# Patient Record
Sex: Male | Born: 1985 | Race: White | Hispanic: No | Smoking: Never smoker
Health system: Southern US, Community
[De-identification: ages and names within clinical notes are randomized; demographics above are authoritative.]

## PROBLEM LIST (undated history)

## (undated) DIAGNOSIS — F419 Anxiety disorder, unspecified: Secondary | ICD-10-CM

## (undated) DIAGNOSIS — J40 Bronchitis, not specified as acute or chronic: Secondary | ICD-10-CM

## (undated) DIAGNOSIS — J449 Chronic obstructive pulmonary disease, unspecified: Secondary | ICD-10-CM

## (undated) HISTORY — PX: CYSTOSCOPY W/ URETEROSCOPY W/ LITHOTRIPSY: SUR380

## (undated) HISTORY — PX: CHOLECYSTECTOMY: SHX55

---

## 2004-01-01 ENCOUNTER — Emergency Department: Payer: Self-pay | Admitting: Emergency Medicine

## 2004-05-09 ENCOUNTER — Emergency Department: Payer: Self-pay | Admitting: Emergency Medicine

## 2004-05-27 ENCOUNTER — Emergency Department: Payer: Self-pay | Admitting: Unknown Physician Specialty

## 2004-05-28 ENCOUNTER — Emergency Department: Payer: Self-pay | Admitting: Emergency Medicine

## 2004-06-11 ENCOUNTER — Emergency Department: Payer: Self-pay | Admitting: Emergency Medicine

## 2004-06-15 ENCOUNTER — Emergency Department: Payer: Self-pay | Admitting: Emergency Medicine

## 2004-07-18 ENCOUNTER — Emergency Department: Payer: Self-pay | Admitting: Emergency Medicine

## 2005-01-08 ENCOUNTER — Emergency Department: Payer: Self-pay | Admitting: Emergency Medicine

## 2005-01-09 ENCOUNTER — Other Ambulatory Visit: Payer: Self-pay

## 2005-05-01 ENCOUNTER — Emergency Department: Payer: Self-pay | Admitting: Emergency Medicine

## 2005-08-28 ENCOUNTER — Emergency Department: Payer: Self-pay | Admitting: Emergency Medicine

## 2006-05-24 ENCOUNTER — Emergency Department: Payer: Self-pay | Admitting: Unknown Physician Specialty

## 2006-05-24 ENCOUNTER — Emergency Department: Payer: Self-pay | Admitting: Emergency Medicine

## 2007-08-24 ENCOUNTER — Emergency Department: Payer: Self-pay | Admitting: Emergency Medicine

## 2007-08-27 ENCOUNTER — Emergency Department: Payer: Self-pay | Admitting: Emergency Medicine

## 2007-08-31 ENCOUNTER — Ambulatory Visit: Payer: Self-pay | Admitting: Urology

## 2008-12-04 ENCOUNTER — Emergency Department: Payer: Self-pay | Admitting: Emergency Medicine

## 2009-03-08 ENCOUNTER — Emergency Department: Payer: Self-pay | Admitting: Emergency Medicine

## 2009-04-25 ENCOUNTER — Emergency Department: Payer: Self-pay | Admitting: Emergency Medicine

## 2009-10-22 ENCOUNTER — Emergency Department: Payer: Self-pay | Admitting: Internal Medicine

## 2010-10-06 ENCOUNTER — Emergency Department: Payer: Self-pay | Admitting: Unknown Physician Specialty

## 2010-10-10 ENCOUNTER — Emergency Department: Payer: Self-pay | Admitting: Emergency Medicine

## 2011-12-21 ENCOUNTER — Emergency Department: Payer: Self-pay | Admitting: Internal Medicine

## 2012-09-12 ENCOUNTER — Emergency Department: Payer: Self-pay | Admitting: Emergency Medicine

## 2013-06-14 ENCOUNTER — Emergency Department: Payer: Self-pay | Admitting: Emergency Medicine

## 2013-06-14 LAB — URINALYSIS, COMPLETE
BILIRUBIN, UR: NEGATIVE
Bacteria: NONE SEEN
Blood: NEGATIVE
Glucose,UR: NEGATIVE mg/dL (ref 0–75)
Ketone: NEGATIVE
Leukocyte Esterase: NEGATIVE
NITRITE: NEGATIVE
PH: 5 (ref 4.5–8.0)
Protein: NEGATIVE
SPECIFIC GRAVITY: 1.017 (ref 1.003–1.030)
Squamous Epithelial: NONE SEEN

## 2013-06-14 LAB — CBC WITH DIFFERENTIAL/PLATELET
BASOS PCT: 1.2 %
Basophil #: 0.1 10*3/uL (ref 0.0–0.1)
EOS ABS: 0.1 10*3/uL (ref 0.0–0.7)
Eosinophil %: 0.9 %
HCT: 45.4 % (ref 40.0–52.0)
HGB: 15.1 g/dL (ref 13.0–18.0)
LYMPHS PCT: 19.7 %
Lymphocyte #: 2.3 10*3/uL (ref 1.0–3.6)
MCH: 28.8 pg (ref 26.0–34.0)
MCHC: 33.4 g/dL (ref 32.0–36.0)
MCV: 86 fL (ref 80–100)
MONO ABS: 0.7 x10 3/mm (ref 0.2–1.0)
Monocyte %: 6 %
Neutrophil #: 8.4 10*3/uL — ABNORMAL HIGH (ref 1.4–6.5)
Neutrophil %: 72.2 %
PLATELETS: 318 10*3/uL (ref 150–440)
RBC: 5.26 10*6/uL (ref 4.40–5.90)
RDW: 13.1 % (ref 11.5–14.5)
WBC: 11.7 10*3/uL — ABNORMAL HIGH (ref 3.8–10.6)

## 2013-06-14 LAB — COMPREHENSIVE METABOLIC PANEL
Albumin: 4.2 g/dL (ref 3.4–5.0)
Alkaline Phosphatase: 73 U/L
Anion Gap: 2 — ABNORMAL LOW (ref 7–16)
BUN: 13 mg/dL (ref 7–18)
Bilirubin,Total: 0.4 mg/dL (ref 0.2–1.0)
Calcium, Total: 9.2 mg/dL (ref 8.5–10.1)
Chloride: 105 mmol/L (ref 98–107)
Co2: 31 mmol/L (ref 21–32)
Creatinine: 1.13 mg/dL (ref 0.60–1.30)
EGFR (African American): >60
EGFR (Non-African Amer.): >60
GLUCOSE: 86 mg/dL (ref 65–99)
OSMOLALITY: 275 (ref 275–301)
Potassium: 3.7 mmol/L (ref 3.5–5.1)
SGOT(AST): 21 U/L (ref 15–37)
SGPT (ALT): 25 U/L (ref 12–78)
SODIUM: 138 mmol/L (ref 136–145)
TOTAL PROTEIN: 7.9 g/dL (ref 6.4–8.2)

## 2013-06-14 LAB — LIPASE, BLOOD: LIPASE: 101 U/L (ref 73–393)

## 2013-11-15 ENCOUNTER — Emergency Department: Payer: Self-pay | Admitting: Emergency Medicine

## 2014-09-22 ENCOUNTER — Encounter: Payer: Self-pay | Admitting: *Deleted

## 2014-09-22 ENCOUNTER — Emergency Department
Admission: EM | Admit: 2014-09-22 | Discharge: 2014-09-22 | Disposition: A | Discharge status: Home / Self Care | Payer: Medicaid Other | Attending: Emergency Medicine | Admitting: Emergency Medicine

## 2014-09-22 DIAGNOSIS — J029 Acute pharyngitis, unspecified: Secondary | ICD-10-CM | POA: Diagnosis present

## 2014-09-22 DIAGNOSIS — R509 Fever, unspecified: Secondary | ICD-10-CM

## 2014-09-22 DIAGNOSIS — Z880 Allergy status to penicillin: Secondary | ICD-10-CM | POA: Insufficient documentation

## 2014-09-22 DIAGNOSIS — J209 Acute bronchitis, unspecified: Secondary | ICD-10-CM | POA: Diagnosis not present

## 2014-09-22 DIAGNOSIS — M791 Myalgia, unspecified site: Secondary | ICD-10-CM

## 2014-09-22 DIAGNOSIS — J020 Streptococcal pharyngitis: Secondary | ICD-10-CM | POA: Insufficient documentation

## 2014-09-22 HISTORY — DX: Bronchitis, not specified as acute or chronic: J40

## 2014-09-22 MED ORDER — IBUPROFEN 800 MG PO TABS
800.0000 mg | ORAL_TABLET | Freq: Once | ORAL | Status: AC
  Administered 2014-09-22: 800 mg via ORAL

## 2014-09-22 MED ORDER — AZITHROMYCIN 250 MG PO TABS
ORAL_TABLET | ORAL | Status: AC
  Administered 2014-09-22: 500 mg via ORAL
  Filled 2014-09-22: qty 2

## 2014-09-22 MED ORDER — AZITHROMYCIN 250 MG PO TABS
500.0000 mg | ORAL_TABLET | Freq: Once | ORAL | Status: AC
  Administered 2014-09-22: 500 mg via ORAL

## 2014-09-22 MED ORDER — IBUPROFEN 800 MG PO TABS
ORAL_TABLET | ORAL | Status: AC
  Filled 2014-09-22: qty 1

## 2014-09-22 MED ORDER — AZITHROMYCIN 500 MG PO TABS: 500.0000 mg | ORAL_TABLET | Freq: Every day | ORAL | Status: AC

## 2014-09-22 NOTE — ED Provider Notes (Signed)
[facility] Emergency Department Provider Note  ____________________________________________  Time seen: 1725   I have reviewed the triage vital signs and the nursing notes.   HISTORY  Chief Complaint Sore Throat     HPI Kyle Mcmillan is a 29 y.o. male who developed a sore throat yesterday. He has some discomfort when he swallows. He reports his throat feels scratchy. He reports that he feels a pressure in his ears as well. He tells me he has very sensitive years and had problems when he was younger 1. He has been having some general aches and pains and chills. He presents to emergency department with a notable fever of 102.8.    He denies any sick contacts.     Past Medical History  Diagnosis Date  . Bronchitis     There are no active problems to display for this patient.   History reviewed. No pertinent past surgical history.  Current Outpatient Rx  Name  Route  Sig  Dispense  Refill  . azithromycin (ZITHROMAX) 500 MG tablet   Oral   Take 1 tablet (500 mg total) by mouth daily. Take 1 tablet daily for 2 days.   2 tablet   0     Allergies Penicillins; Phenobarbital; and Tetanus toxoids  No family history on file.  Social History History  Substance Use Topics  . Smoking status: Never Smoker   . Smokeless tobacco: Not on file  . Alcohol Use: Not on file    Review of Systems  Constitutional: Negative for fever. ENT: Negative for sore throat. Cardiovascular: Negative for chest pain. Respiratory: Negative for shortness of breath. Gastrointestinal: Negative for abdominal pain, vomiting and diarrhea. Genitourinary: Negative for dysuria. Musculoskeletal: No myalgias or injuries. Skin: Negative for rash. Neurological: Negative for headaches   10-point ROS otherwise negative.  ____________________________________________   PHYSICAL EXAM:  VITAL SIGNS: ED Triage Vitals  Enc Vitals Group     BP 09/22/14 1644 151/90 mmHg    Pulse Rate 09/22/14 1644 149     Resp --      Temp 09/22/14 1644 102.8 F (39.3 C)     Temp Source 09/22/14 1644 Oral     SpO2 09/22/14 1644 100 %     Weight 09/22/14 1644 245 lb (111.131 kg)     Height 09/22/14 1644 5\' 11"  (1.803 m)     Head Cir --      Peak Flow --      Pain Score 09/22/14 1645 6     Pain Loc --      Pain Edu? --      Excl. in GC? --     Constitutional:  Alert and oriented. Well appearing and in no distress after having been given ibuprofen in triage.[facility]   Head: Normocephalic and atraumatic.   Nose: No congestion/rhinnorhea.   Mouth/Throat: Mucous membranes are moist. The posterior oropharynx is mildly erythemic. There is no discharge.  Ears: Minimal redness in the canals, normal TM's, no fluid or bulge. Cardiovascular: Normal rate, regular rhythm, no murmur noted Respiratory:  Normal respiratory effort, no tachypnea.    Breath sounds are clear and equal bilaterally.  Gastrointestinal: Soft and nontender. No distention.  Musculoskeletal: No deformity noted. Nontender with normal range of motion in all extremities.  No noted edema. Neurologic:  Normal speech and language. No gross focal neurologic deficits are appreciated.  Skin:  Skin is warm, dry. No rash noted. Psychiatric: Mood and affect are normal. Speech and behavior are  normal.  ____________________________________________    LABS (pertinent positives/negatives)  Point-of-care strep test is positive.  ____________________________________________  INITIAL IMPRESSION / ASSESSMENT AND PLAN / ED COURSE  Pertinent labs & imaging results that were available during my care of the patient were reviewed by me and considered in my medical decision making (see chart for details).  Patient with a positive strep test, a fever to 102.8, general myalgias, and a sore throat. We will treat him for strep pharyngitis. He does feel better now that he has been given ibuprofen here. We will start him on  azithromycin, as he is allergic to penicillins.  ____________________________________________   FINAL CLINICAL IMPRESSION(S) / ED DIAGNOSES  Final diagnoses:  Strep pharyngitis  Fever, unspecified fever cause  Myalgia      Darien Ramus, MD 09/22/14 [phone]

## 2014-09-22 NOTE — ED Notes (Signed)
POC Strep positive

## 2014-09-22 NOTE — ED Notes (Signed)
Pt here for sore throat and not feeling well 

## 2014-09-22 NOTE — Discharge Instructions (Signed)

## 2014-09-25 LAB — CULTURE, GROUP A STREP (THRC)

## 2015-06-19 ENCOUNTER — Encounter: Payer: Self-pay | Admitting: Emergency Medicine

## 2015-06-19 ENCOUNTER — Emergency Department
Admission: EM | Admit: 2015-06-19 | Discharge: 2015-06-19 | Disposition: A | Discharge status: Home / Self Care | Payer: Medicaid Other | Attending: Emergency Medicine | Admitting: Emergency Medicine

## 2015-06-19 ENCOUNTER — Emergency Department: Payer: Medicaid Other

## 2015-06-19 DIAGNOSIS — Y998 Other external cause status: Secondary | ICD-10-CM | POA: Insufficient documentation

## 2015-06-19 DIAGNOSIS — S93402A Sprain of unspecified ligament of left ankle, initial encounter: Secondary | ICD-10-CM | POA: Diagnosis not present

## 2015-06-19 DIAGNOSIS — W1842XA Slipping, tripping and stumbling without falling due to stepping into hole or opening, initial encounter: Secondary | ICD-10-CM | POA: Diagnosis not present

## 2015-06-19 DIAGNOSIS — Y9389 Activity, other specified: Secondary | ICD-10-CM | POA: Insufficient documentation

## 2015-06-19 DIAGNOSIS — Y9289 Other specified places as the place of occurrence of the external cause: Secondary | ICD-10-CM | POA: Diagnosis not present

## 2015-06-19 DIAGNOSIS — S9002XA Contusion of left ankle, initial encounter: Secondary | ICD-10-CM | POA: Diagnosis not present

## 2015-06-19 DIAGNOSIS — Z880 Allergy status to penicillin: Secondary | ICD-10-CM | POA: Insufficient documentation

## 2015-06-19 DIAGNOSIS — S99912A Unspecified injury of left ankle, initial encounter: Secondary | ICD-10-CM | POA: Diagnosis present

## 2015-06-19 MED ORDER — IBUPROFEN 600 MG PO TABS: 600.0000 mg | ORAL_TABLET | Freq: Four times a day (QID) | ORAL | Status: DC | PRN

## 2015-06-19 NOTE — Discharge Instructions (Signed)
Elastic Bandage and RICE WHAT DOES AN ELASTIC BANDAGE DO? Elastic bandages come in different shapes and sizes. They generally provide support to your injury and reduce swelling while you are healing, but they can perform different functions. Your health care provider will help you to decide what is best for your protection, recovery, or rehabilitation following an injury. WHAT ARE SOME GENERAL TIPS FOR USING AN ELASTIC BANDAGE?  Use the bandage as directed by the maker of the bandage that you are using.  Do not wrap the bandage too tightly. This may cut off the circulation in the arm or leg in the area below the bandage.  If part of your body beyond the bandage becomes blue, numb, cold, swollen, or is more painful, your bandage is most likely too tight. If this occurs, remove your bandage and reapply it more loosely.  See your health care provider if the bandage seems to be making your problems worse rather than better.  An elastic bandage should be removed and reapplied every 3-4 hours or as directed by your health care provider. WHAT IS RICE? The routine care of many injuries includes rest, ice, compression, and elevation (RICE therapy).  Rest Rest is required to allow your body to heal. Generally, you can resume your routine activities when you are comfortable and have been given permission by your health care provider. Ice Icing your injury helps to keep the swelling down and it reduces pain. Do not apply ice directly to your skin.  Put ice in a plastic bag.  Place a towel between your skin and the bag.  Leave the ice on for 20 minutes, 2-3 times per day. Do this for as long as you are directed by your health care provider. Compression Compression helps to keep swelling down, gives support, and helps with discomfort. Compression may be done with an elastic bandage. Elevation Elevation helps to reduce swelling and it decreases pain. If possible, your injured area should be placed  at or above the level of your heart or the center of your chest. WHEN SHOULD I SEEK MEDICAL CARE? You should seek medical care if:  You have persistent pain and swelling.  Your symptoms are getting worse rather than improving. These symptoms may indicate that further evaluation or further X-rays are needed. Sometimes, X-rays may not show a small broken bone (fracture) until a number of days later. Make a follow-up appointment with your health care provider. Ask when your X-ray results will be ready. Make sure that you get your X-ray results. WHEN SHOULD I SEEK IMMEDIATE MEDICAL CARE? You should seek immediate medical care if:  You have a sudden onset of severe pain at or below the area of your injury.  You develop redness or increased swelling around your injury.  You have tingling or numbness at or below the area of your injury that does not improve after you remove the elastic bandage.   This information is not intended to replace advice given to you by your health care provider. Make sure you discuss any questions you have with your health care provider.   Document Released: 09/07/2001 Document Revised: 12/07/2014 Document Reviewed: 11/01/2013 Elsevier Interactive Patient Education 2016 [employer] Contusion A contusion is a deep bruise. Contusions happen when an injury causes bleeding under the skin. Symptoms of bruising include pain, swelling, and discolored skin. The skin may turn blue, purple, or yellow. HOME CARE   Rest the injured area.  If told, put ice on the injured area.  Put ice in a plastic bag.  Place a towel between your skin and the bag.  Leave the ice on for 20 minutes, 2-3 times per day.  If told, put light pressure (compression) on the injured area using an elastic bandage. Make sure the bandage is not too tight. Remove it and put it back on as told by your doctor.  If possible, raise (elevate) the injured area above the level of your heart while you are  sitting or lying down.  Take over-the-counter and prescription medicines only as told by your doctor. GET HELP IF:  Your symptoms do not get better after several days of treatment.  Your symptoms get worse.  You have trouble moving the injured area. GET HELP RIGHT AWAY IF:   You have very bad pain.  You have a loss of feeling (numbness) in a hand or foot.  Your hand or foot turns pale or cold.   This information is not intended to replace advice given to you by your health care provider. Make sure you discuss any questions you have with your health care provider.   Document Released: 09/04/2007 Document Revised: 12/07/2014 Document Reviewed: 08/03/2014 Elsevier Interactive Patient Education 2016 [employer]  Acute Ankle Sprain With Phase I Rehab An acute ankle sprain is a partial or complete tear in one or more of the ligaments of the ankle due to traumatic injury. The severity of the injury depends on both the number of ligaments sprained and the grade of sprain. There are 3 grades of sprains.   A grade 1 sprain is a mild sprain. There is a slight pull without obvious tearing. There is no loss of strength, and the muscle and ligament are the correct length.  A grade 2 sprain is a moderate sprain. There is tearing of fibers within the substance of the ligament where it connects two bones or two cartilages. The length of the ligament is increased, and there is usually decreased strength.  A grade 3 sprain is a complete rupture of the ligament and is uncommon. In addition to the grade of sprain, there are three types of ankle sprains.  Lateral ankle sprains: This is a sprain of one or more of the three ligaments on the outer side (lateral) of the ankle. These are the most common sprains. Medial ankle sprains: There is one large triangular ligament of the inner side (medial) of the ankle that is susceptible to injury. Medial ankle sprains are less common. Syndesmosis, "high ankle,"  sprains: The syndesmosis is the ligament that connects the two bones of the lower leg. Syndesmosis sprains usually only occur with very severe ankle sprains. SYMPTOMS  Pain, tenderness, and swelling in the ankle, starting at the side of injury that may progress to the whole ankle and foot with time.  "Pop" or tearing sensation at the time of injury.  Bruising that may spread to the heel.  Impaired ability to walk soon after injury. CAUSES   Acute ankle sprains are caused by trauma placed on the ankle that temporarily forces or pries the anklebone (talus) out of its normal socket.  Stretching or tearing of the ligaments that normally hold the joint in place (usually due to a twisting injury). RISK INCREASES WITH:  Previous ankle sprain.  Sports in which the foot may land awkwardly (i.e., basketball, volleyball, or soccer) or walking or running on uneven or rough surfaces.  Shoes with inadequate support to prevent sideways motion when stress occurs.  Poor strength and flexibility.  Poor balance skills.  Contact sports. PREVENTION   Warm up and stretch properly before activity.  Maintain physical fitness:  Ankle and leg flexibility, muscle strength, and endurance.  Cardiovascular fitness.  Balance training activities.  Use proper technique and have a coach correct improper technique.  Taping, protective strapping, bracing, or high-top tennis shoes may help prevent injury. Initially, tape is best; however, it loses most of its support function within 10 to 15 minutes.  Wear proper-fitted protective shoes (High-top shoes with taping or bracing is more effective than either alone).  Provide the ankle with support during sports and practice activities for 12 months following injury. PROGNOSIS   If treated properly, ankle sprains can be expected to recover completely; however, the length of recovery depends on the degree of injury.  A grade 1 sprain usually heals enough in  5 to 7 days to allow modified activity and requires an average of 6 weeks to heal completely.  A grade 2 sprain requires 6 to 10 weeks to heal completely.  A grade 3 sprain requires 12 to 16 weeks to heal.  A syndesmosis sprain often takes more than 3 months to heal. RELATED COMPLICATIONS   Frequent recurrence of symptoms may result in a chronic problem. Appropriately addressing the problem the first time decreases the frequency of recurrence and optimizes healing time. Severity of the initial sprain does not predict the likelihood of later instability.  Injury to other structures (bone, cartilage, or tendon).  A chronically unstable or arthritic ankle joint is a possibility with repeated sprains. TREATMENT Treatment initially involves the use of ice, medication, and compression bandages to help reduce pain and inflammation. Ankle sprains are usually immobilized in a walking cast or boot to allow for healing. Crutches may be recommended to reduce pressure on the injury. After immobilization, strengthening and stretching exercises may be necessary to regain strength and a full range of motion. Surgery is rarely needed to treat ankle sprains. MEDICATION   Nonsteroidal anti-inflammatory medications, such as aspirin and ibuprofen (do not take for the first 3 days after injury or within 7 days before surgery), or other minor pain relievers, such as acetaminophen, are often recommended. Take these as directed by your caregiver. Contact your caregiver immediately if any bleeding, stomach upset, or signs of an allergic reaction occur from these medications.  Ointments applied to the skin may be helpful.  Pain relievers may be prescribed as necessary by your caregiver. Do not take prescription pain medication for longer than 4 to 7 days. Use only as directed and only as much as you need. HEAT AND COLD  Cold treatment (icing) is used to relieve pain and reduce inflammation for acute and chronic cases.  Cold should be applied for 10 to 15 minutes every 2 to 3 hours for inflammation and pain and immediately after any activity that aggravates your symptoms. Use ice packs or an ice massage.  Heat treatment may be used before performing stretching and strengthening activities prescribed by your caregiver. Use a heat pack or a warm soak. SEEK IMMEDIATE MEDICAL CARE IF:   Pain, swelling, or bruising worsens despite treatment.  You experience pain, numbness, discoloration, or coldness in the foot or toes.  New, unexplained symptoms develop (drugs used in treatment may produce side effects.) EXERCISES  PHASE I EXERCISES RANGE OF MOTION (ROM) AND STRETCHING EXERCISES - Ankle Sprain, Acute Phase I, Weeks 1 to 2 These exercises may help you when beginning to restore flexibility in your ankle. You will likely  work on these exercises for the 1 to 2 weeks after your injury. Once your physician, physical therapist, or athletic trainer sees adequate progress, he or she will advance your exercises. While completing these exercises, remember:   Restoring tissue flexibility helps normal motion to return to the joints. This allows healthier, less painful movement and activity.  An effective stretch should be held for at least 30 seconds.  A stretch should never be painful. You should only feel a gentle lengthening or release in the stretched tissue. RANGE OF MOTION - Dorsi/Plantar Flexion  While sitting with your right / left knee straight, draw the top of your foot upwards by flexing your ankle. Then reverse the motion, pointing your toes downward.  Hold each position for __________ seconds.  After completing your first set of exercises, repeat this exercise with your knee bent. Repeat __________ times. Complete this exercise __________ times per day.  RANGE OF MOTION - Ankle Alphabet  Imagine your right / left big toe is a pen.  Keeping your hip and knee still, write out the entire alphabet with your  "pen." Make the letters as large as you can without increasing any discomfort. Repeat __________ times. Complete this exercise __________ times per day.  STRENGTHENING EXERCISES - Ankle Sprain, Acute -Phase I, Weeks 1 to 2 These exercises may help you when beginning to restore strength in your ankle. You will likely work on these exercises for 1 to 2 weeks after your injury. Once your physician, physical therapist, or athletic trainer sees adequate progress, he or she will advance your exercises. While completing these exercises, remember:   Muscles can gain both the endurance and the strength needed for everyday activities through controlled exercises.  Complete these exercises as instructed by your physician, physical therapist, or athletic trainer. Progress the resistance and repetitions only as guided.  You may experience muscle soreness or fatigue, but the pain or discomfort you are trying to eliminate should never worsen during these exercises. If this pain does worsen, stop and make certain you are following the directions exactly. If the pain is still present after adjustments, discontinue the exercise until you can discuss the trouble with your clinician. STRENGTH - Dorsiflexors  Secure a rubber exercise band/tubing to a fixed object (i.e., table, pole) and loop the other end around your right / left foot.  Sit on the floor facing the fixed object. The band/tubing should be slightly tense when your foot is relaxed.  Slowly draw your foot back toward you using your ankle and toes.  Hold this position for __________ seconds. Slowly release the tension in the band and return your foot to the starting position. Repeat __________ times. Complete this exercise __________ times per day.  STRENGTH - Plantar-flexors   Sit with your right / left leg extended. Holding onto both ends of a rubber exercise band/tubing, loop it around the ball of your foot. Keep a slight tension in the  band.  Slowly push your toes away from you, pointing them downward.  Hold this position for __________ seconds. Return slowly, controlling the tension in the band/tubing. Repeat __________ times. Complete this exercise __________ times per day.  STRENGTH - Ankle Eversion  Secure one end of a rubber exercise band/tubing to a fixed object (table, pole). Loop the other end around your foot just before your toes.  Place your fists between your knees. This will focus your strengthening at your ankle.  Drawing the band/tubing across your opposite foot, slowly, pull your little  toe out and up. Make sure the band/tubing is positioned to resist the entire motion.  Hold this position for __________ seconds. Have your muscles resist the band/tubing as it slowly pulls your foot back to the starting position.  Repeat __________ times. Complete this exercise __________ times per day.  STRENGTH - Ankle Inversion  Secure one end of a rubber exercise band/tubing to a fixed object (table, pole). Loop the other end around your foot just before your toes.  Place your fists between your knees. This will focus your strengthening at your ankle.  Slowly, pull your big toe up and in, making sure the band/tubing is positioned to resist the entire motion.  Hold this position for __________ seconds.  Have your muscles resist the band/tubing as it slowly pulls your foot back to the starting position. Repeat __________ times. Complete this exercises __________ times per day.  STRENGTH - Towel Curls  Sit in a chair positioned on a non-carpeted surface.  Place your right / left foot on a towel, keeping your heel on the floor.  Pull the towel toward your heel by only curling your toes. Keep your heel on the floor.  If instructed by your physician, physical therapist, or athletic trainer, add weight to the end of the towel. Repeat __________ times. Complete this exercise __________ times per day.   This  information is not intended to replace advice given to you by your health care provider. Make sure you discuss any questions you have with your health care provider.   Document Released: 10/17/2004 Document Revised: 04/08/2014 Document Reviewed: 06/30/2008 Elsevier Interactive Patient Education [employer]

## 2015-06-19 NOTE — ED Notes (Signed)
See triage noted   States he stepped in a hole  Twisted left ankle  Min swelling noted positive pulses and good circulation

## 2015-06-19 NOTE — ED Notes (Signed)
Stepped in hole yesterday and injured left ankle.

## 2015-06-19 NOTE — ED Provider Notes (Signed)
[facility] Emergency Department Provider Note  ____________________________________________  Time seen: Approximately 8:43 AM  I have reviewed the triage vital signs and the nursing notes.   HISTORY  Chief Complaint Ankle Pain    HPI Kyle Mcmillan is a 30 y.o. male , NAD, presents to the emergency department after stepping in a hole yesterday and twisting his left ankle. Heard a pop at the time of the incident. Has been able to bear weight but with pain to the lateral side of the ankle. Denies any numbness, weakness, tingling. Has had mild swelling to the outside of the ankle with some bruising. Notes she's had full range of motion with only mild pain to the outside of the ankle. Denies chest pain, shortness breath, visual changes, weakness that cause a fall in noted the fall was mechanical in nature.   Past Medical History  Diagnosis Date  . Bronchitis     There are no active problems to display for this patient.   No past surgical history on file.  Current Outpatient Rx  Name  Route  Sig  Dispense  Refill  . ibuprofen (ADVIL,MOTRIN) 600 MG tablet   Oral   Take 1 tablet (600 mg total) by mouth every 6 (six) hours as needed.   30 tablet   0     Allergies Penicillins; Phenobarbital; and Tetanus toxoids  No family history on file.  Social History Social History  Substance Use Topics  . Smoking status: Never Smoker   . Smokeless tobacco: None  . Alcohol Use: No     Review of Systems  Constitutional: No fever/chills Eyes: No visual changes. Cardiovascular: No chest pain. Respiratory:  No shortness of breath. Musculoskeletal: Positive left ankle pain. Negative for back pain, knee pain, foot pain.  Skin: Also swelling, bruising left ankle. Negative for rash, redness, warmth, laceration. Neurological: Negative for headaches, focal weakness or numbness. No tingling. 10-point ROS otherwise  negative.  ____________________________________________   PHYSICAL EXAM:  VITAL SIGNS: ED Triage Vitals  Enc Vitals Group     BP 06/19/15 0838 150/98 mmHg     Pulse Rate 06/19/15 0838 92     Resp 06/19/15 0838 20     Temp 06/19/15 0838 98.1 F (36.7 C)     Temp Source 06/19/15 0838 Oral     SpO2 06/19/15 0838 96 %     Weight 06/19/15 0838 245 lb (111.131 kg)     Height 06/19/15 0838  (1.803 m)     Head Cir --      Peak Flow --      Pain Score 06/19/15 0835 7     Pain Loc --      Pain Edu? --      Excl. in GC? --     Constitutional: Alert and oriented. Well appearing and in no acute distress. Eyes: Conjunctivae are normal.  Head: Atraumatic. Neck: Supple with full range of motion. Cardiovascular:   Good peripheral circulation with left lower extremity having 2+ pulses. Respiratory: Normal respiratory effort without tachypnea or retractions.  Musculoskeletal: Tender to palpation about the lateral ankle about the distal lateral malleolus. No crepitus or deformity noted to palpation. No edema. Patient has full range of motion of the left ankle with only minimal pain. Actually with anterior posterior drawer of the left ankle.  No joint effusions. Neurologic:  Normal speech and language. No gross focal neurologic deficits are appreciated.  Skin:  Left lateral ankle with erythematous bruising. Skin is  warm, dry and intact. No rash, redness, swelling, open wound noted. Psychiatric: Mood and affect are normal. Speech and behavior are normal. Patient exhibits appropriate insight and judgement.   ____________________________________________   LABS  None  ____________________________________________  EKG  None ____________________________________________  RADIOLOGY I have personally viewed and evaluated these images (plain radiographs) as part of my medical decision making, as well as reviewing the written report by the radiologist.  Dg Ankle Complete  Left  06/19/2015  CLINICAL DATA:  Ankle injury. EXAM: LEFT ANKLE COMPLETE - 3+ VIEW COMPARISON:  No recent prior. FINDINGS: Corticated bony density noted adjacent to the lateral malleolus, most likely old fracture fragment. No evidence of acute fracture. IMPRESSION: Old lateral malleolar fracture.  No acute abnormality. Electronically Signed   By: Maisie Fushomas  Register   On: 06/19/2015 09:01    ____________________________________________    PROCEDURES  Procedure(s) performed: None    Medications - No data to display   ____________________________________________   INITIAL IMPRESSION / ASSESSMENT AND PLAN / ED COURSE  Pertinent imaging results that were available during my care of the patient were reviewed by me and considered in my medical decision making (see chart for details).  Patient's diagnosis is consistent with left ankle sprain and contusion. Patient will be discharged home with prescriptions for ibuprofen. May continue to ice the affected area 20 minutes 3-4 times daily. Continue to keep an Ace wrap Scratches over the next 2-5 days until able to ambulate without assistance. Work note given for light duty over the next few days to allow sprain to heal. [facility] if symptoms persist past this treatment course. Patient is given ED precautions to return to the ED for any worsening or new symptoms.    ____________________________________________  FINAL CLINICAL IMPRESSION(S) / ED DIAGNOSES  Final diagnoses:  Left ankle sprain, initial encounter  Contusion of left ankle, initial encounter      NEW MEDICATIONS STARTED DURING THIS VISIT:  New Prescriptions   IBUPROFEN (ADVIL,MOTRIN) 600 MG TABLET    Take 1 tablet (600 mg total) by mouth every 6 (six) hours as needed.         Hope PigeonJami L Hagler, PA-C 06/19/15 0910  Governor Rooksebecca Lord, MD 06/19/15 1017

## 2015-12-11 ENCOUNTER — Emergency Department: Payer: Medicaid Other

## 2015-12-11 ENCOUNTER — Encounter: Payer: Self-pay | Admitting: Emergency Medicine

## 2015-12-11 ENCOUNTER — Emergency Department
Admission: EM | Admit: 2015-12-11 | Discharge: 2015-12-11 | Disposition: A | Discharge status: Home / Self Care | Payer: Medicaid Other | Attending: Student | Admitting: Student

## 2015-12-11 DIAGNOSIS — T148XXA Other injury of unspecified body region, initial encounter: Secondary | ICD-10-CM

## 2015-12-11 DIAGNOSIS — W182XXA Fall in (into) shower or empty bathtub, initial encounter: Secondary | ICD-10-CM | POA: Diagnosis not present

## 2015-12-11 DIAGNOSIS — Y929 Unspecified place or not applicable: Secondary | ICD-10-CM | POA: Insufficient documentation

## 2015-12-11 DIAGNOSIS — S5012XA Contusion of left forearm, initial encounter: Secondary | ICD-10-CM | POA: Diagnosis not present

## 2015-12-11 DIAGNOSIS — Y9389 Activity, other specified: Secondary | ICD-10-CM | POA: Diagnosis not present

## 2015-12-11 DIAGNOSIS — Y999 Unspecified external cause status: Secondary | ICD-10-CM | POA: Diagnosis not present

## 2015-12-11 DIAGNOSIS — W19XXXA Unspecified fall, initial encounter: Secondary | ICD-10-CM

## 2015-12-11 DIAGNOSIS — S59912A Unspecified injury of left forearm, initial encounter: Secondary | ICD-10-CM | POA: Diagnosis present

## 2015-12-11 MED ORDER — MELOXICAM 15 MG PO TABS: 15.0000 mg | ORAL_TABLET | Freq: Every day | ORAL | 0 refills | Status: DC

## 2015-12-11 NOTE — ED Notes (Signed)
Pt presents to ED with c/o LEFT forearm pain. Noted swelling and bruises to the area. pt stattes was getting out of shower this morning and slipped pt states caught himself on the rail but left arm hit the wall. Pt denies LOC and denies hitting head. Pt has good pulses. Pain to left forearm 8/10.

## 2015-12-11 NOTE — ED Provider Notes (Signed)
[facility] Emergency Department Provider Note  ____________________________________________  Time seen: Approximately 9:05 PM  I have reviewed the triage vital signs and the nursing notes.   HISTORY  Chief Complaint Fall    HPI Kyle Mcmillan is a 30 y.o. male presents for evaluation of falling in the bathtub early this morning complaining left forearm pain and bruising. Denies any loss consciousness no other injuries at this time.   Past Medical History:  Diagnosis Date  . Bronchitis     There are no active problems to display for this patient.   History reviewed. No pertinent surgical history.  Prior to Admission medications   Medication Sig Start Date End Date Taking? Authorizing Provider  meloxicam (MOBIC) 15 MG tablet Take 1 tablet (15 mg total) by mouth daily. 12/11/15   Evangeline Dakinharles M Nieko Clarin, PA-C    Allergies Penicillins; Phenobarbital; and Tetanus toxoids  No family history on file.  Social History Social History  Substance Use Topics  . Smoking status: Never Smoker  . Smokeless tobacco: Never Used  . Alcohol use No    Review of Systems Constitutional: No fever/chills Cardiovascular: Denies chest pain. Respiratory: Denies shortness of breath. Musculoskeletal: Positive for left forearm pain. Skin: Negative for rash. Neurological: Negative for headaches, focal weakness or numbness.  10-point ROS otherwise negative.  ____________________________________________   PHYSICAL EXAM:  VITAL SIGNS: ED Triage Vitals  Enc Vitals Group     BP 12/11/15 2009 (!) 141/83     Pulse Rate 12/11/15 2009 99     Resp 12/11/15 2009 18     Temp 12/11/15 2009 97.7 F (36.5 C)     Temp Source 12/11/15 2009 Oral     SpO2 12/11/15 2009 99 %     Weight 12/11/15 2010 260 lb (117.9 kg)     Height 12/11/15 2010 5\' 11"  (1.803 m)     Head Circumference --      Peak Flow --      Pain Score 12/11/15 2010 8     Pain Loc --      Pain Edu? --    Excl. in GC? --     Constitutional: Alert and oriented. Well appearing and in no acute distress. Musculoskeletal: Left forearm with ecchymosis and bruising noted. Range of motion point tenderness only. Distally neurovascularly intact. Neurologic:  Normal speech and language. No gross focal neurologic deficits are appreciated. No gait instability. Skin:  Skin is warm, dry and intact. No rash noted. Psychiatric: Mood and affect are normal. Speech and behavior are normal.  ____________________________________________   LABS (all labs ordered are listed, but only abnormal results are displayed)  Labs Reviewed - No data to display ____________________________________________  EKG   ____________________________________________  RADIOLOGY   ____________________________________________   PROCEDURES  Procedure(s) performed: None  Critical Care performed: No  ____________________________________________   INITIAL IMPRESSION / ASSESSMENT AND PLAN / ED COURSE  Pertinent labs & imaging results that were available during my care of the patient were reviewed by me and considered in my medical decision making (see chart for details). Review of the [place] CSRS was performed in accordance of the [facility] prior to dispensing any controlled drugs.  Status post fall with left forearm contusion. Reassurance provided to patient. Rx given for meloxicam 15 mg daily. Follow up with PCP or return to ER as needed.  Clinical Course    ____________________________________________   FINAL CLINICAL IMPRESSION(S) / ED DIAGNOSES  Final diagnoses:  Fall, initial encounter  Contusion  This chart was dictated using voice recognition software/Dragon. Despite best efforts to proofread, errors can occur which can change the meaning. Any change was purely unintentional.    Evangeline Dakin, PA-C 12/11/15 1610    Gayla Doss, MD 12/11/15 2355

## 2015-12-11 NOTE — ED Notes (Signed)
Discharge instructions reviewed with patient. Questions fielded by this RN. Patient verbalizes understanding of instructions. Patient discharged home in stable condition per Peers PA . No acute distress noted at time of discharge.

## 2015-12-11 NOTE — ED Triage Notes (Signed)
Pt presents to ED with c/o LEFT forearm pain, pt reports was getting out of shower this morning and slipped., states caught himself on the rail but left arm hit the wall. Swelling and bruising noted to left forearm. Pt denies hitting head or LOC. PT alert and oriented x 4. (+) pulse, movement, and sensation to left arm.

## 2017-01-17 ENCOUNTER — Emergency Department: Payer: Medicaid Other

## 2017-01-17 ENCOUNTER — Emergency Department
Admission: EM | Admit: 2017-01-17 | Discharge: 2017-01-17 | Disposition: A | Discharge status: Home / Self Care | Payer: Medicaid Other | Attending: Emergency Medicine | Admitting: Emergency Medicine

## 2017-01-17 DIAGNOSIS — M25512 Pain in left shoulder: Secondary | ICD-10-CM | POA: Insufficient documentation

## 2017-01-17 MED ORDER — MELOXICAM 7.5 MG PO TABS: 7.5000 mg | ORAL_TABLET | Freq: Every day | ORAL | 1 refills | Status: AC

## 2017-01-17 NOTE — ED Provider Notes (Signed)
[facility] Emergency Department Provider Note  ____________________________________________  Time seen: Approximately 11:01 PM  I have reviewed the triage vital signs and the nursing notes.   HISTORY  Chief Complaint Shoulder Pain (left)    HPI Kyle Mcmillan is a 31 y.o. male resistance to the emergency department with 10 out of 10 left shoulder pain for the past 4 days. Patient reports that his pain is worsened with motion at the shoulder. Patient reports that his shoulder feels "stiff". Patient is unsure if he has a history of diabetes as he does not have established care with a primary care provider. He denies chest pain, chest tightness, shortness of breath, nausea, vomiting and abdominal pain. He denies weakness, radiculopathy or changes in sensation of the upper extremities. Patient reports that his left shoulder pain radiates to left elbow. No alleviating measures have been attempted.    Past Medical History:  Diagnosis Date  . Bronchitis     There are no active problems to display for this patient.   History reviewed. No pertinent surgical history.  Prior to Admission medications   Medication Sig Start Date End Date Taking? Authorizing Provider  meloxicam (MOBIC) 7.5 MG tablet Take 1 tablet (7.5 mg total) by mouth daily. 01/17/17 01/24/17  Orvil FeilWoods, Jaclyn M, PA-C    Allergies Penicillins; Phenobarbital; and Tetanus toxoids  No family history on file.  Social History Social History  Substance Use Topics  . Smoking status: Never Smoker  . Smokeless tobacco: Never Used  . Alcohol use No     Review of Systems  Constitutional: No fever/chills Eyes: No visual changes. No discharge ENT: No upper respiratory complaints. Cardiovascular: no chest pain. Respiratory: no cough. No SOB. Musculoskeletal: Patient has left shoulder pain.  Skin: Negative for rash, abrasions, lacerations, ecchymosis. Neurological: Negative for headaches, focal  weakness or numbness.   ____________________________________________   PHYSICAL EXAM:  VITAL SIGNS: ED Triage Vitals  Enc Vitals Group     BP 01/17/17 2156 (!) 147/75     Pulse Rate 01/17/17 2156 (!) 109     Resp 01/17/17 2156 16     Temp 01/17/17 2156 98.2 F (36.8 C)     Temp Source 01/17/17 2156 Oral     SpO2 01/17/17 2156 97 %     Weight 01/17/17 2157 260 lb (117.9 kg)     Height 01/17/17 2157 5\' 11"  (1.803 m)     Head Circumference --      Peak Flow --      Pain Score 01/17/17 2159 6     Pain Loc --      Pain Edu? --      Excl. in GC? --      Constitutional: Alert and oriented. Well appearing and in no acute distress. Eyes: Conjunctivae are normal. PERRL. EOMI. Head: Atraumatic. Cardiovascular: Normal rate, regular rhythm. Normal S1 and S2.  Good peripheral circulation. Respiratory: Normal respiratory effort without tachypnea or retractions. Lungs CTAB. Good air entry to the bases with no decreased or absent breath sounds. Musculoskeletal: Patient is able to demonstrate limited range of motion at the left shoulder, likely secondary to pain. No weakness was elicited with left rotator cuff testing. There were no palpable deficits in the insertion of the biceps tendon. Palpable radial pulse, left. Neurologic:  Normal speech and language. No gross focal neurologic deficits are appreciated.  Skin:  Skin is warm, dry and intact. No rash noted. Psychiatric: Mood and affect are normal. Speech and behavior are  normal. Patient exhibits appropriate insight and judgement.   ____________________________________________   LABS (all labs ordered are listed, but only abnormal results are displayed)  Labs Reviewed - No data to display ____________________________________________  EKG   ____________________________________________  RADIOLOGY Geraldo Pitter, personally viewed and evaluated these images (plain radiographs) as part of my medical decision making, as well as  reviewing the written report by the radiologist.  Dg Shoulder Left  Result Date: 01/17/2017 CLINICAL DATA:  31 y/o  M; left shoulder pain. EXAM: LEFT SHOULDER - 2+ VIEW COMPARISON:  None. FINDINGS: There is no evidence of fracture or dislocation. There is no evidence of arthropathy or other focal bone abnormality. Soft tissues are unremarkable. IMPRESSION: Negative. Electronically Signed   By: Mitzi Hansen M.D.   On: 01/17/2017 22:23    ____________________________________________    PROCEDURES  Procedure(s) performed:    Procedures    Medications - No data to display   ____________________________________________   INITIAL IMPRESSION / ASSESSMENT AND PLAN / ED COURSE  Pertinent labs & imaging results that were available during my care of the patient were reviewed by me and considered in my medical decision making (see chart for details).  Review of the  CSRS was performed in accordance of the [facility] prior to dispensing any controlled drugs.     Assessment and plan Left shoulder pain Patient presents to the emergency department with left shoulder pain for the past 4 days. Differential diagnosis includes adhesive capsulitis versus rotator cuff tendinitis versus biceps tendon strain versus biceps tendon rupture. X-ray examination conducted in the emergency department reveals no bony abnormalities. Patient was discharged with meloxicam. Vital signs are reassuring prior to discharge. All patient questions were answered.   ____________________________________________  FINAL CLINICAL IMPRESSION(S) / ED DIAGNOSES  Final diagnoses:  Acute pain of left shoulder      NEW MEDICATIONS STARTED DURING THIS VISIT:  New Prescriptions   MELOXICAM (MOBIC) 7.5 MG TABLET    Take 1 tablet (7.5 mg total) by mouth daily.        This chart was dictated using voice recognition software/Dragon. Despite best efforts to proofread, errors can occur which can change the  meaning. Any change was purely unintentional.    Orvil Feil, PA-C 01/17/17 2309    Jene Every, MD 01/17/17 [phone]

## 2017-01-17 NOTE — ED Triage Notes (Signed)
Patient c/o left shoulder pain. Patient reports lifting heavy objects 4 days ago. Patient reports pain since, however reports pain had been getting better until today.

## 2017-01-17 NOTE — ED Notes (Signed)
Reviewed d/c instructions, follow-up care, prescriptions with patient. Pt verbalized understanding.  

## 2017-04-02 ENCOUNTER — Emergency Department
Admission: EM | Admit: 2017-04-02 | Discharge: 2017-04-02 | Disposition: A | Discharge status: Home / Self Care | Payer: Medicaid Other | Attending: Emergency Medicine | Admitting: Emergency Medicine

## 2017-04-02 ENCOUNTER — Other Ambulatory Visit: Payer: Self-pay

## 2017-04-02 DIAGNOSIS — Z5321 Procedure and treatment not carried out due to patient leaving prior to being seen by health care provider: Secondary | ICD-10-CM | POA: Diagnosis not present

## 2017-04-02 DIAGNOSIS — M545 Low back pain: Secondary | ICD-10-CM | POA: Diagnosis not present

## 2017-04-02 LAB — URINALYSIS, COMPLETE (UACMP) WITH MICROSCOPIC
Bilirubin Urine: NEGATIVE
GLUCOSE, UA: NEGATIVE mg/dL
HGB URINE DIPSTICK: NEGATIVE
KETONES UR: NEGATIVE mg/dL
Leukocytes, UA: NEGATIVE
Nitrite: NEGATIVE
PH: 5 (ref 5.0–8.0)
PROTEIN: NEGATIVE mg/dL
Specific Gravity, Urine: 1.018 (ref 1.005–1.030)

## 2017-04-02 LAB — CHLAMYDIA/NGC RT PCR (ARMC ONLY)
Chlamydia Tr: NOT DETECTED
N gonorrhoeae: NOT DETECTED

## 2017-04-02 NOTE — ED Triage Notes (Addendum)
Pt in with co left lower back pain that radiates to testicular area. Also co urinary frequency and pain. Does not have an abnormal discharge from his penis.

## 2017-04-03 ENCOUNTER — Telehealth: Payer: Self-pay | Admitting: Emergency Medicine

## 2017-04-03 NOTE — Telephone Encounter (Addendum)
Called patient due to lwot to inquire about condition and follow up plans. Person who answered says he isnot there , but she will give him message that I called to check on him  Patient called me back.  He says he is fine now.  I did advise he be seen by a physician. He says he will go to his pcp

## 2018-10-05 ENCOUNTER — Other Ambulatory Visit: Payer: Self-pay

## 2018-10-05 ENCOUNTER — Emergency Department: Payer: Medicaid Other

## 2018-10-05 ENCOUNTER — Emergency Department
Admission: EM | Admit: 2018-10-05 | Discharge: 2018-10-05 | Disposition: A | Discharge status: Home / Self Care | Payer: Medicaid Other | Attending: Emergency Medicine | Admitting: Emergency Medicine

## 2018-10-05 DIAGNOSIS — Y939 Activity, unspecified: Secondary | ICD-10-CM | POA: Diagnosis not present

## 2018-10-05 DIAGNOSIS — Y999 Unspecified external cause status: Secondary | ICD-10-CM | POA: Diagnosis not present

## 2018-10-05 DIAGNOSIS — S6991XA Unspecified injury of right wrist, hand and finger(s), initial encounter: Secondary | ICD-10-CM | POA: Diagnosis present

## 2018-10-05 DIAGNOSIS — Y929 Unspecified place or not applicable: Secondary | ICD-10-CM | POA: Diagnosis not present

## 2018-10-05 DIAGNOSIS — S60211A Contusion of right wrist, initial encounter: Secondary | ICD-10-CM | POA: Insufficient documentation

## 2018-10-05 DIAGNOSIS — S59911A Unspecified injury of right forearm, initial encounter: Secondary | ICD-10-CM | POA: Diagnosis not present

## 2018-10-05 DIAGNOSIS — M79631 Pain in right forearm: Secondary | ICD-10-CM | POA: Diagnosis not present

## 2018-10-05 DIAGNOSIS — W2209XA Striking against other stationary object, initial encounter: Secondary | ICD-10-CM | POA: Diagnosis not present

## 2018-10-05 NOTE — ED Triage Notes (Signed)
Pt hit right wrist on table today and is c/o of pain. No obvious deformity noted.

## 2018-10-05 NOTE — ED Provider Notes (Signed)
[facility] Emergency Department Provider Note  ____________________________________________  Time seen: Approximately 9:59 PM  I have reviewed the triage vital signs and the nursing notes.   HISTORY  Chief Complaint Wrist Pain    HPI Kyle Mcmillan is a 33 y.o. male that presents to the emergency department for evaluation of right arm injury today.  Patient hit his right wrist on a table about 2 hours ago.  He is not having any pain to his wrist or his arm but he noticed a bruise so he decided to come to the emergency department to have it evaluated.  He denies any pain currently.  No wounds.  No numbness, tingling.   Past Medical History:  Diagnosis Date  . Bronchitis     There are no active problems to display for this patient.   No past surgical history on file.  Prior to Admission medications   Not on File    Allergies Penicillins, Phenobarbital, and Tetanus toxoids  No family history on file.  Social History Social History   Tobacco Use  . Smoking status: Never Smoker  . Smokeless tobacco: Never Used  Substance Use Topics  . Alcohol use: No  . Drug use: Not on file     Review of Systems  Gastrointestinal:  No nausea, no vomiting.  Musculoskeletal: Negative for musculoskeletal pain. Skin: Negative for rash, abrasions, lacerations.  Positive for ecchymosis. Neurological: Negative for numbness or tingling   ____________________________________________   PHYSICAL EXAM:  VITAL SIGNS: ED Triage Vitals  Enc Vitals Group     BP 10/05/18 2100 (!) 145/88     Pulse Rate 10/05/18 2100 (!) 109     Resp 10/05/18 2100 18     Temp 10/05/18 2100 98.6 F (37 C)     Temp Source 10/05/18 2100 Oral     SpO2 10/05/18 2100 97 %     Weight 10/05/18 2101 275 lb (124.7 kg)     Height 10/05/18 2101 5\' 11"  (1.803 m)     Head Circumference --      Peak Flow --      Pain Score 10/05/18 2119 1     Pain Loc --      Pain Edu? --      Excl.  in GC? --      Constitutional: Alert and oriented. Well appearing and in no acute distress. Eyes: Conjunctivae are normal. PERRL. EOMI. Head: Atraumatic. ENT:      Ears:      Nose: No congestion/rhinnorhea.      Mouth/Throat: Mucous membranes are moist.  Neck: No stridor. Cardiovascular: Normal rate, regular rhythm.  Good peripheral circulation. Respiratory: Normal respiratory effort without tachypnea or retractions. Lungs CTAB. Good air entry to the bases with no decreased or absent breath sounds. Musculoskeletal: Full range of motion to all extremities. No gross deformities appreciated.  Full range of motion of right wrist without pain. Neurologic:  Normal speech and language. No gross focal neurologic deficits are appreciated.  Skin:  Skin is warm, dry.  Nickel-sized bruise to right dorsal wrist. Psychiatric: Mood and affect are normal. Speech and behavior are normal. Patient exhibits appropriate insight and judgement.   ____________________________________________   LABS (all labs ordered are listed, but only abnormal results are displayed)  Labs Reviewed - No data to display ____________________________________________  EKG   ____________________________________________  RADIOLOGY Lexine BatonI, Weslie Rasmus, personally viewed and evaluated these images (plain radiographs) as part of my medical decision making, as well as reviewing  the written report by the radiologist.  Dg Forearm Right  Result Date: 10/05/2018 CLINICAL DATA:  Pain following injury EXAM: RIGHT FOREARM - 2 VIEW COMPARISON:  None. FINDINGS: Frontal and lateral views were obtained. No fracture or dislocation. Joint spaces appear normal. No erosive change. IMPRESSION: No fracture or dislocation.  No evident arthropathy. Electronically Signed   By: Lowella Grip III M.D.   On: 10/05/2018 21:16    ____________________________________________    PROCEDURES  Procedure(s) performed:     Procedures    Medications - No data to display   ____________________________________________   INITIAL IMPRESSION / ASSESSMENT AND PLAN / ED COURSE  Pertinent labs & imaging results that were available during my care of the patient were reviewed by me and considered in my medical decision making (see chart for details).  Review of the [place] CSRS was performed in accordance of the [place] prior to dispensing any controlled drugs.     Patient's diagnosis is consistent with contusion.  Vital signs and exam are reassuring.  X-ray negative for acute bony abnormalities.  Ice pack was given. Patient is to follow up with primary care as directed. Patient is given ED precautions to return to the ED for any worsening or new symptoms.   Kyle Mcmillan was evaluated in Emergency Department on 10/05/2018 for the symptoms described in the history of present illness. He was evaluated in the context of the global COVID-19 pandemic, which necessitated consideration that the patient might be at risk for infection with the SARS-CoV-2 virus that causes COVID-19. Institutional protocols and algorithms that pertain to the evaluation of patients at risk for COVID-19 are in a state of rapid change based on information released by regulatory bodies including the CDC and federal and state organizations. These policies and algorithms were followed during the patient's care in the ED. ____________________________________________  FINAL CLINICAL IMPRESSION(S) / ED DIAGNOSES  Final diagnoses:  Contusion of right wrist, initial encounter      NEW MEDICATIONS STARTED DURING THIS VISIT:  ED Discharge Orders    None          This chart was dictated using voice recognition software/Dragon. Despite best efforts to proofread, errors can occur which can change the meaning. Any change was purely unintentional.    Laban Emperor, PA-C 10/05/18 2245    Earleen Newport, MD 10/05/18 2322

## 2018-12-08 DIAGNOSIS — F419 Anxiety disorder, unspecified: Secondary | ICD-10-CM | POA: Diagnosis not present

## 2018-12-08 DIAGNOSIS — E7800 Pure hypercholesterolemia, unspecified: Secondary | ICD-10-CM | POA: Diagnosis not present

## 2018-12-08 DIAGNOSIS — J019 Acute sinusitis, unspecified: Secondary | ICD-10-CM | POA: Diagnosis not present

## 2018-12-08 DIAGNOSIS — R51 Headache: Secondary | ICD-10-CM | POA: Diagnosis not present

## 2018-12-08 DIAGNOSIS — Z1331 Encounter for screening for depression: Secondary | ICD-10-CM | POA: Diagnosis not present

## 2018-12-08 DIAGNOSIS — K219 Gastro-esophageal reflux disease without esophagitis: Secondary | ICD-10-CM | POA: Diagnosis not present

## 2019-05-18 DIAGNOSIS — Z1331 Encounter for screening for depression: Secondary | ICD-10-CM | POA: Diagnosis not present

## 2019-05-18 DIAGNOSIS — K219 Gastro-esophageal reflux disease without esophagitis: Secondary | ICD-10-CM | POA: Diagnosis not present

## 2019-05-18 DIAGNOSIS — F419 Anxiety disorder, unspecified: Secondary | ICD-10-CM | POA: Diagnosis not present

## 2019-05-18 DIAGNOSIS — R519 Headache, unspecified: Secondary | ICD-10-CM | POA: Diagnosis not present

## 2019-05-18 DIAGNOSIS — J449 Chronic obstructive pulmonary disease, unspecified: Secondary | ICD-10-CM | POA: Diagnosis not present

## 2019-06-01 DIAGNOSIS — R519 Headache, unspecified: Secondary | ICD-10-CM | POA: Diagnosis not present

## 2019-06-01 DIAGNOSIS — E7800 Pure hypercholesterolemia, unspecified: Secondary | ICD-10-CM | POA: Diagnosis not present

## 2019-06-01 DIAGNOSIS — R5383 Other fatigue: Secondary | ICD-10-CM | POA: Diagnosis not present

## 2019-06-01 DIAGNOSIS — K219 Gastro-esophageal reflux disease without esophagitis: Secondary | ICD-10-CM | POA: Diagnosis not present

## 2019-06-01 DIAGNOSIS — F329 Major depressive disorder, single episode, unspecified: Secondary | ICD-10-CM | POA: Diagnosis not present

## 2019-07-08 DIAGNOSIS — K219 Gastro-esophageal reflux disease without esophagitis: Secondary | ICD-10-CM | POA: Diagnosis not present

## 2019-07-08 DIAGNOSIS — R519 Headache, unspecified: Secondary | ICD-10-CM | POA: Diagnosis not present

## 2019-07-08 DIAGNOSIS — E669 Obesity, unspecified: Secondary | ICD-10-CM | POA: Diagnosis not present

## 2019-07-08 DIAGNOSIS — J449 Chronic obstructive pulmonary disease, unspecified: Secondary | ICD-10-CM | POA: Diagnosis not present

## 2019-11-05 ENCOUNTER — Emergency Department
Admission: EM | Admit: 2019-11-05 | Discharge: 2019-11-05 | Discharge status: Against Medical Advice | Payer: Medicaid Other

## 2019-11-09 DIAGNOSIS — Z03818 Encounter for observation for suspected exposure to other biological agents ruled out: Secondary | ICD-10-CM | POA: Diagnosis not present

## 2019-11-09 DIAGNOSIS — Z20822 Contact with and (suspected) exposure to covid-19: Secondary | ICD-10-CM | POA: Diagnosis not present

## 2019-12-14 ENCOUNTER — Emergency Department
Admission: EM | Admit: 2019-12-14 | Discharge: 2019-12-15 | Disposition: A | Discharge status: Home / Self Care | Payer: Medicaid Other | Attending: Emergency Medicine | Admitting: Emergency Medicine

## 2019-12-14 DIAGNOSIS — Y999 Unspecified external cause status: Secondary | ICD-10-CM | POA: Diagnosis not present

## 2019-12-14 DIAGNOSIS — R0789 Other chest pain: Secondary | ICD-10-CM | POA: Diagnosis not present

## 2019-12-14 DIAGNOSIS — Y9389 Activity, other specified: Secondary | ICD-10-CM | POA: Diagnosis not present

## 2019-12-14 DIAGNOSIS — R0781 Pleurodynia: Secondary | ICD-10-CM | POA: Diagnosis not present

## 2019-12-14 DIAGNOSIS — Y92410 Unspecified street and highway as the place of occurrence of the external cause: Secondary | ICD-10-CM | POA: Insufficient documentation

## 2019-12-14 DIAGNOSIS — S299XXA Unspecified injury of thorax, initial encounter: Secondary | ICD-10-CM | POA: Diagnosis not present

## 2019-12-15 ENCOUNTER — Emergency Department: Payer: Medicaid Other

## 2019-12-15 DIAGNOSIS — R0781 Pleurodynia: Secondary | ICD-10-CM | POA: Diagnosis not present

## 2019-12-15 MED ORDER — IBUPROFEN 800 MG PO TABS: 800.0000 mg | ORAL_TABLET | Freq: Three times a day (TID) | ORAL | 0 refills | Status: DC | PRN

## 2019-12-15 MED ORDER — IBUPROFEN 800 MG PO TABS
800.0000 mg | ORAL_TABLET | Freq: Once | ORAL | Status: AC
  Administered 2019-12-15: 800 mg via ORAL
  Filled 2019-12-15: qty 1
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
## 2020-01-19 ENCOUNTER — Emergency Department: Payer: Medicaid Other

## 2020-01-19 ENCOUNTER — Emergency Department
Admission: EM | Admit: 2020-01-19 | Discharge: 2020-01-20 | Disposition: A | Discharge status: Other Acute Inpatient Hospital | Payer: Medicaid Other | Attending: Emergency Medicine | Admitting: Emergency Medicine

## 2020-01-19 ENCOUNTER — Other Ambulatory Visit: Payer: Self-pay

## 2020-01-19 DIAGNOSIS — T1491XA Suicide attempt, initial encounter: Secondary | ICD-10-CM | POA: Diagnosis not present

## 2020-01-19 DIAGNOSIS — X828XXA Other intentional self-harm by crashing of motor vehicle, initial encounter: Secondary | ICD-10-CM

## 2020-01-19 DIAGNOSIS — R519 Headache, unspecified: Secondary | ICD-10-CM | POA: Diagnosis not present

## 2020-01-19 DIAGNOSIS — M542 Cervicalgia: Secondary | ICD-10-CM | POA: Diagnosis not present

## 2020-01-19 DIAGNOSIS — R55 Syncope and collapse: Secondary | ICD-10-CM | POA: Insufficient documentation

## 2020-01-19 DIAGNOSIS — F322 Major depressive disorder, single episode, severe without psychotic features: Secondary | ICD-10-CM | POA: Diagnosis not present

## 2020-01-19 DIAGNOSIS — R0781 Pleurodynia: Secondary | ICD-10-CM | POA: Insufficient documentation

## 2020-01-19 DIAGNOSIS — Z20822 Contact with and (suspected) exposure to covid-19: Secondary | ICD-10-CM | POA: Insufficient documentation

## 2020-01-19 DIAGNOSIS — I10 Essential (primary) hypertension: Secondary | ICD-10-CM | POA: Diagnosis not present

## 2020-01-19 DIAGNOSIS — R52 Pain, unspecified: Secondary | ICD-10-CM | POA: Diagnosis not present

## 2020-01-19 DIAGNOSIS — R45851 Suicidal ideations: Secondary | ICD-10-CM | POA: Diagnosis not present

## 2020-01-19 DIAGNOSIS — R0902 Hypoxemia: Secondary | ICD-10-CM | POA: Diagnosis not present

## 2020-01-19 DIAGNOSIS — Z041 Encounter for examination and observation following transport accident: Secondary | ICD-10-CM | POA: Diagnosis not present

## 2020-01-19 LAB — COMPREHENSIVE METABOLIC PANEL
ALT: 24 U/L (ref 0–44)
AST: 17 U/L (ref 15–41)
Albumin: 4.3 g/dL (ref 3.5–5.0)
Alkaline Phosphatase: 56 U/L (ref 38–126)
Anion gap: 9 (ref 5–15)
BUN: 7 mg/dL (ref 6–20)
CO2: 27 mmol/L (ref 22–32)
Calcium: 9.2 mg/dL (ref 8.9–10.3)
Chloride: 104 mmol/L (ref 98–111)
Creatinine, Ser: 1.18 mg/dL (ref 0.61–1.24)
GFR, Estimated: >60 mL/min (ref >60)
Glucose, Bld: 104 mg/dL — ABNORMAL HIGH (ref 70–99)
Potassium: 3.9 mmol/L (ref 3.5–5.1)
Sodium: 140 mmol/L (ref 135–145)
Total Bilirubin: 1 mg/dL (ref 0.3–1.2)
Total Protein: 7.2 g/dL (ref 6.5–8.1)

## 2020-01-19 LAB — CBC
HCT: 45.7 % (ref 39.0–52.0)
Hemoglobin: 15.7 g/dL (ref 13.0–17.0)
MCH: 30.1 pg (ref 26.0–34.0)
MCHC: 34.4 g/dL (ref 30.0–36.0)
MCV: 87.7 fL (ref 80.0–100.0)
Platelets: 252 10*3/uL (ref 150–400)
RBC: 5.21 MIL/uL (ref 4.22–5.81)
RDW: 13.3 % (ref 11.5–15.5)
WBC: 10.3 10*3/uL (ref 4.0–10.5)
nRBC: 0 % (ref 0.0–0.2)

## 2020-01-19 LAB — URINE DRUG SCREEN, QUALITATIVE (ARMC ONLY)
Amphetamines, Ur Screen: NOT DETECTED
Barbiturates, Ur Screen: NOT DETECTED
Benzodiazepine, Ur Scrn: NOT DETECTED
Cannabinoid 50 Ng, Ur ~~LOC~~: NOT DETECTED
Cocaine Metabolite,Ur ~~LOC~~: NOT DETECTED
MDMA (Ecstasy)Ur Screen: NOT DETECTED
Methadone Scn, Ur: NOT DETECTED
Opiate, Ur Screen: NOT DETECTED
Phencyclidine (PCP) Ur S: NOT DETECTED
Tricyclic, Ur Screen: NOT DETECTED

## 2020-01-19 LAB — RESPIRATORY PANEL BY RT PCR (FLU A&B, COVID)
Influenza A by PCR: NEGATIVE
Influenza B by PCR: NEGATIVE
SARS Coronavirus 2 by RT PCR: NEGATIVE

## 2020-01-19 LAB — ETHANOL: Alcohol, Ethyl (B): <10 mg/dL (ref <10)

## 2020-01-19 LAB — SALICYLATE LEVEL: Salicylate Lvl: <7 mg/dL — ABNORMAL LOW (ref 7.0–30.0)

## 2020-01-19 LAB — ACETAMINOPHEN LEVEL: Acetaminophen (Tylenol), Serum: <10 ug/mL — ABNORMAL LOW (ref 10–30)

## 2020-01-19 MED ORDER — IBUPROFEN 600 MG PO TABS: 600.0000 mg | ORAL_TABLET | Freq: Four times a day (QID) | ORAL | Status: DC | PRN

## 2020-01-19 MED ORDER — TRAZODONE HCL 100 MG PO TABS: 100.0000 mg | ORAL_TABLET | Freq: Every evening | ORAL | Status: DC | PRN

## 2020-01-19 MED ORDER — HYDROXYZINE HCL 25 MG PO TABS: 25.0000 mg | ORAL_TABLET | ORAL | Status: DC | PRN

## 2020-01-19 NOTE — ED Notes (Signed)
Patient's brother is here to visit.

## 2020-01-19 NOTE — ED Notes (Addendum)
Dr.Clapacs at bedside  
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
## 2020-01-19 NOTE — ED Notes (Signed)
Pt father Pepe Mineau [phone] called and would like to speak with the RN caring for his son. Father voices concern for patient and self-harm

## 2020-01-19 NOTE — ED Notes (Signed)
This RN and Woodroe Chen, Risk manager at bedside assisted. Belongings includes: jeans, black belt, maroon underwear, multicolored sweatshirt, pair black shoes, wedding ring, ID, and phone.
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
## 2020-01-19 NOTE — ED Notes (Signed)
PT  PLACED  UNDER  IVC PAPERS  PER  DR  Vicente Males MD  INFORMED  KELLY  RN  AND  STEPHAINE  CHARGE NURSE
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
## 2020-01-19 NOTE — ED Notes (Signed)
Kyle Mcmillan with patient. Patient's brother wanted a piece of paper and a pen so that the patient could write out that his estranged wife couldn't use the checking account. Security guard counseled the brother about how and if that could be done. Patient's brother asked for the patient's cell phone and was informed that the cell phone was locked up. Patient declined to have the brother take the cell phone. New contact numbers were obtained.  Brother states that he and their father were concerned that patient would commit suicide if discharged.

## 2020-01-19 NOTE — ED Notes (Signed)
Patient states he has 4 kids that are with their mother.

## 2020-01-19 NOTE — BH Assessment (Addendum)
Patient is to be admitted to [facility] by Psychiatric Nurse Practitioner Gillermo Murdoch.  Attending Physician will be Dr. Les Pou.   Patient has been assigned to room 304, by [facility] Charge Nurse Marylu Lund.    ER staff is aware of the admission:  [facility] ER Secretary    Dr. Katrinka Blazing, ER MD   [facility] Patient's Nurse   Joyce Gross Patient Access.
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
## 2020-01-19 NOTE — ED Notes (Signed)
Report given to Hewan Mezgebu RN 

## 2020-01-20 ENCOUNTER — Inpatient Hospital Stay
Admission: RE | Admit: 2020-01-20 | Discharge: 2020-01-24 | DRG: 885 | Disposition: A | Discharge status: Home / Self Care | Payer: Medicaid Other | Source: Intra-hospital | Attending: Behavioral Health | Admitting: Behavioral Health

## 2020-01-20 ENCOUNTER — Other Ambulatory Visit: Payer: Self-pay

## 2020-01-20 DIAGNOSIS — Z9151 Personal history of suicidal behavior: Secondary | ICD-10-CM | POA: Diagnosis not present

## 2020-01-20 DIAGNOSIS — F332 Major depressive disorder, recurrent severe without psychotic features: Principal | ICD-10-CM | POA: Diagnosis present

## 2020-01-20 DIAGNOSIS — Z20822 Contact with and (suspected) exposure to covid-19: Secondary | ICD-10-CM | POA: Diagnosis not present

## 2020-01-20 DIAGNOSIS — Z887 Allergy status to serum and vaccine status: Secondary | ICD-10-CM

## 2020-01-20 DIAGNOSIS — Z888 Allergy status to other drugs, medicaments and biological substances status: Secondary | ICD-10-CM

## 2020-01-20 DIAGNOSIS — Z880 Allergy status to penicillin: Secondary | ICD-10-CM | POA: Diagnosis not present

## 2020-01-20 DIAGNOSIS — G4700 Insomnia, unspecified: Secondary | ICD-10-CM | POA: Diagnosis present

## 2020-01-20 DIAGNOSIS — R45851 Suicidal ideations: Secondary | ICD-10-CM | POA: Diagnosis not present

## 2020-01-20 DIAGNOSIS — X828XXA Other intentional self-harm by crashing of motor vehicle, initial encounter: Secondary | ICD-10-CM | POA: Diagnosis present

## 2020-01-20 MED ORDER — BUSPIRONE HCL 5 MG PO TABS
5.0000 mg | ORAL_TABLET | Freq: Three times a day (TID) | ORAL | Status: DC
  Administered 2020-01-20 – 2020-01-24 (×12): 5 mg via ORAL
  Filled 2020-01-20 (×12): qty 1

## 2020-01-20 MED ORDER — MAGNESIUM HYDROXIDE 400 MG/5ML PO SUSP: 30.0000 mL | Freq: Every day | ORAL | Status: DC | PRN

## 2020-01-20 MED ORDER — ALUM & MAG HYDROXIDE-SIMETH 200-200-20 MG/5ML PO SUSP: 30.0000 mL | ORAL | Status: DC | PRN

## 2020-01-20 MED ORDER — HYDROXYZINE HCL 25 MG PO TABS: 25.0000 mg | ORAL_TABLET | ORAL | Status: DC | PRN

## 2020-01-20 MED ORDER — BUSPIRONE HCL 5 MG PO TABS
15.0000 mg | ORAL_TABLET | Freq: Three times a day (TID) | ORAL | Status: DC
  Administered 2020-01-20 (×2): 15 mg via ORAL
  Filled 2020-01-20 (×2): qty 3

## 2020-01-20 MED ORDER — IBUPROFEN 600 MG PO TABS: 600.0000 mg | ORAL_TABLET | Freq: Four times a day (QID) | ORAL | Status: DC | PRN

## 2020-01-20 MED ORDER — MIRTAZAPINE 15 MG PO TABS
15.0000 mg | ORAL_TABLET | Freq: Every day | ORAL | Status: DC
  Administered 2020-01-20 – 2020-01-23 (×4): 15 mg via ORAL
  Filled 2020-01-20 (×4): qty 1

## 2020-01-20 MED ORDER — TRAZODONE HCL 100 MG PO TABS
100.0000 mg | ORAL_TABLET | Freq: Every evening | ORAL | Status: DC | PRN
  Filled 2020-01-20: qty 1

## 2020-01-20 MED ORDER — ACETAMINOPHEN 325 MG PO TABS: 650.0000 mg | ORAL_TABLET | Freq: Four times a day (QID) | ORAL | Status: DC | PRN

## 2020-01-20 NOTE — [facility] Suicide Risk Assessment (Signed)
[facility] INPATIENT:  Family/Significant Other Suicide Prevention Education  Suicide Prevention Education:  Patient Refusal for Family/Significant Other Suicide Prevention Education: The patient Kyle Mcmillan has refused to provide written consent for family/significant other to be provided Family/Significant Other Suicide Prevention Education during admission and/or prior to discharge.  Physician notified.  SPE completed with pt, as pt refused to consent to family contact. SPI pamphlet provided to pt and pt was encouraged to share information with support network, ask questions, and talk about any concerns relating to SPE. Pt denies access to guns/firearms and verbalized understanding of information provided. Mobile Crisis information also provided to pt.    Harden Mo 01/20/2020, 10:55 AM

## 2020-01-20 NOTE — [facility] Group Notes (Signed)
LCSW Group Therapy Note  01/20/2020 2:48 PM  Type of Therapy/Topic:  Group Therapy:  Balance in Life  Participation Level:  None  Description of Group:    This group will address the concept of balance and how it feels and looks when one is unbalanced. Patients will be encouraged to process areas in their lives that are out of balance and identify reasons for remaining unbalanced. Facilitators will guide patients in utilizing problem-solving interventions to address and correct the stressor making their life unbalanced. Understanding and applying boundaries will be explored and addressed for obtaining and maintaining a balanced life. Patients will be encouraged to explore ways to assertively make their unbalanced needs known to significant others in their lives, using other group members and facilitator for support and feedback.  Therapeutic Goals: 1. Patient will identify two or more emotions or situations they have that consume much of in their lives. 2. Patient will identify signs/triggers that life has become out of balance:  3. Patient will identify two ways to set boundaries in order to achieve balance in their lives:  4. Patient will demonstrate ability to communicate their needs through discussion and/or role plays  Summary of Patient Progress: Patient did not participate in the group outside of attendance.  Therapeutic Modalities:   Cognitive Behavioral Therapy Solution-Focused Therapy Assertiveness Training  [employer]. Algis Greenhouse, MSW, LCSW, LCAS 01/20/2020 2:48 PM

## 2020-01-20 NOTE — Progress Notes (Signed)
BRIEF PHARMACY NOTE   This patient attended and participated in Medication Management Group counseling led by [facility] staff pharmacist.  This interactive class reviews basic information about prescription medications and education on personal responsibility in medication management.  The class also includes general knowledge of 3 main classes of behavioral medications, including antipsychotics, antidepressants, and mood stabilizers.     Patient behavior was appropriate for group setting.   Educational materials sourced from:  "Medication Do's and Don'ts" from [email or web address]   "Mental Health Medications" from [facility] of Mental Health [email or web address]#[email or web address]    Albina Billet, PharmD, BCPS Clinical Pharmacist 01/20/2020 3:18 PM

## 2020-01-20 NOTE — Progress Notes (Signed)
Recreation Therapy Notes  Date: 01/20/2020  Time: 9:30 am  Location: Room 21    Behavioral response: Appropriate   Intervention Topic: Animal Assisted Therapy   Discussion/Intervention:  Animal Assisted Therapy took place today during group.  Animal Assisted Therapy is the planned inclusion of an animal in a patient's treatment plan. The patients were able to engage in therapy with an animal during group. Participants were educated on what a service dog is and the different between a support dog and a service dog. Patient were informed on the many animal needs there are and how their needs are similar. Individuals were enlightened on the process to get a service animal or support animal. Patients got the opportunity to pet the animal and were offered emotional support from the animal and staff.  Clinical Observations/Feedback:  Patient came to group and was on topic and was focused on what peers and staff had to say. Participant shared their experiences and history with animals. Individual was social with peers, staff and animal while participating in group.  Kyle Mcmillan LRT/CTRS         Naman Spychalski 01/20/2020 11:54 AM

## 2020-01-20 NOTE — ED Notes (Signed)
Hourly rounding reveals patient in room. No complaints, stable, in no acute distress. Q15 minute rounds and monitoring via Rover and Officer to continue.   
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
## 2020-01-20 NOTE — ED Notes (Signed)
Patient is stable in NAD. He is transferred to [facility] via NT and officer. Patient belongings and IVC paper work sent with patient to [facility]. Report given to [facility]. No issues.
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
## 2020-01-20 NOTE — Tx Team (Signed)
Initial Treatment Plan 01/20/2020 5:21 AM Tobin Chad TSV:[id number]    PATIENT STRESSORS: Marital or family conflict Occupational concerns   PATIENT STRENGTHS: Capable of independent living Financial means   PATIENT IDENTIFIED PROBLEMS: Marital Issues  Medication Stabilization                   DISCHARGE CRITERIA:  Improved stabilization in mood, thinking, and/or behavior Verbal commitment to aftercare and medication compliance  PRELIMINARY DISCHARGE PLAN: Outpatient therapy Return to previous living arrangement  PATIENT/FAMILY INVOLVEMENT: This treatment plan has been presented to and reviewed with the patient, Kyle Mcmillan, and/or family member.  The patient and family have been given the opportunity to ask questions and make suggestions.  Elbert Ewings, RN 01/20/2020, 5:21 AM

## 2020-01-20 NOTE — Plan of Care (Signed)
Patient is visible in the milieu.Appropriate with staff & peers.Patient states " my mind is set up for my kids now.I am not suicidal anymore." Denies HI & AVH.Attended groups.Compliant with medications.Appetite and energy level good.Support and encouragement given.

## 2020-01-21 NOTE — Progress Notes (Signed)
Recreation Therapy Notes  Date: 01/21/2020  Time: 9:30 am   Location: Craft room   Behavioral response: Appropriate  Intervention Topic: Communication   Discussion/Intervention:  Group content today was focused on communication. The group defined communication and ways to communicate with others. Individuals stated reason why communication is important and some reasons to communicate with others. Patients expressed if they thought they were good at communicating with others and ways they could improve their communication skills. The group identified important parts of communication and some experiences they have had in the past with communication. The group participated in the intervention "What is that?", where they had a chance to test out their communication skills and identify ways to improve their communication techniques.  Clinical Observations/Feedback: Patient came to group and was focused on what peers and staff had to say about communication. Individual was social with peers and staff while participating in the intervention.  Ras Kollman LRT/CTRS           Kamali Sakata 01/21/2020 11:52 AM

## 2020-01-21 NOTE — BHH Group Notes (Signed)
LCSW Group Therapy Note  01/21/2020 2:15 PM  Type of Therapy and Topic:  Group Therapy:  Feelings around Relapse and Recovery  Participation Level:  None   Description of Group:    Patients in this group will discuss emotions they experience before and after a relapse. They will process how experiencing these feelings, or avoidance of experiencing them, relates to having a relapse. Facilitator will guide patients to explore emotions they have related to recovery. Patients will be encouraged to process which emotions are more powerful. They will be guided to discuss the emotional reaction significant others in their lives may have to their relapse or recovery. Patients will be assisted in exploring ways to respond to the emotions of others without this contributing to a relapse.  Therapeutic Goals: 1. Patient will identify two or more emotions that lead to a relapse for them 2. Patient will identify two emotions that result when they relapse 3. Patient will identify two emotions related to recovery 4. Patient will demonstrate ability to communicate their needs through discussion and/or role plays   Summary of Patient Progress: Patient was present for group, however, did not engage in speech.    Therapeutic Modalities:   Cognitive Behavioral Therapy Solution-Focused Therapy Assertiveness Training Relapse Prevention Therapy   Penni Homans, MSW, LCSW 01/21/2020 2:15 PM

## 2020-01-21 NOTE — Progress Notes (Signed)
D: Pt alert and oriented. Pt rates depression 0/10, hopelessness 0/10, and anxiety 0/10. Pt goal: "Get to go home." Pt reports energy level as normal and concentration as being good. Pt reports sleep last night as being good. Pt did not receive medications for sleep. Pt denies experiencing any pain at this time. Pt denies experiencing any SI/HI, or AVH at this time.   A: Scheduled medications administered to pt, per MD orders. Support and encouragement provided. Frequent verbal contact made. Routine safety checks conducted q15 minutes.   R: No adverse drug reactions noted. Pt verbally contracts for safety at this time. Pt complaint with medications and treatment plan. Pt interacts well with others on the unit. Pt remains safe at this time. Will continue to monitor.

## 2020-01-21 NOTE — Progress Notes (Signed)
Recreation Therapy Notes  INPATIENT RECREATION THERAPY ASSESSMENT  Patient Details Name: Kyle Mcmillan MRN: [id number] DOB: [birth year] Today's Date: 01/21/2020       Information Obtained From: Patient  Able to Participate in Assessment/Interview: Yes  Patient Presentation: Responsive  Reason for Admission (Per Patient): Active Symptoms  Patient Stressors: Relationship  Coping Skills:   Music, Exercise, Talk  Leisure Interests (2+):  Social - Friends, Exercise - Walking, Games - [employer], Music - Singing, Music - Risk manager, Music - Listen  Frequency of Recreation/Participation:    Awareness of Community Resources:     [employer]:     Current Use:    If no, Barriers?:    Expressed Interest in [employer] Information:    [place] of Residence:  Film/video editor  Patient Main Form of Transportation: Set designer  Patient Strengths:  Caring  Patient Identified Areas of Improvement:  Handle anger better and listen to others  Patient Goal for Hospitalization:  To get better  Current SI (including self-harm):  No  Current HI:  No  Current AVH: No  Staff Intervention Plan: Collaborate with Interdisciplinary Treatment Team, Group Attendance  Consent to Intern Participation: N/A  Shayden Gingrich 01/21/2020, 2:35 PM

## 2020-01-21 NOTE — Plan of Care (Signed)
  Problem: Role Relationship: Goal: Will demonstrate positive changes in social behaviors and relationships Outcome: Progressing   Problem: Safety: Goal: Ability to disclose and discuss suicidal ideas will improve Outcome: Progressing   Problem: Self-Concept: Goal: Will verbalize positive feelings about self Outcome: Progressing
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
## 2020-01-21 NOTE — Progress Notes (Signed)
Patient presents with sad flat affect. Denies any SI, HI, AVH. Patient reports he is ready to get on with his life and take care of his children. Reports his wife does not give a shit about him, so his focus is on his children and how to better take care of them. Patient medication compliant. Appropriate with staff and peers. Encouragement and support provided. Safety checks maintained. Pt receptive and remains safe on unit with q 15 min checks.
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
## 2020-01-22 NOTE — Progress Notes (Signed)
D- Patient alert and oriented. Affect/mood is cooperative and flat. Pt denies SI, HI, AVH, and pain. Pt states that he just wants to go home so he can start his life over.   A- Scheduled medications administered to patient, per MD orders. Support and encouragement provided.  Routine safety checks conducted every 15 minutes.  Patient informed to notify staff with problems or concerns.  R- No adverse drug reactions noted. Patient contracts for safety at this time. Patient compliant with medications and treatment plan. Patient receptive, calm, and cooperative. Patient interacts well with others on the unit.  Patient remains safe at this time.  Torrie Mayers RN

## 2020-01-22 NOTE — Progress Notes (Signed)
Patient presents with sad flat affect but is cooperative with treatment. He denies any SI, HI, AVH. Patient was medication compliant. Appropriate with staff and peers. Encouragement and support provided. Safety checks maintained. Patient appears to be resting in bed quietly at this time.
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
## 2020-01-22 NOTE — [facility] Group Notes (Signed)
Patient was present in AA/NA group.   Penni Homans, MSW, LCSW 01/22/2020 2:26 PM
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
## 2020-01-22 NOTE — [facility] Group Notes (Signed)
[facility] Group Notes:  (Nursing/MHT/Case Management/Adjunct)  Date:  01/22/2020  Time:  8:49 PM  Type of Therapy:  Group Therapy  Participation Level:  Active  Participation Quality:  Appropriate  Affect:  Appropriate  Cognitive:  Alert  Insight:  Appropriate  Engagement in Group:  Engaged and goal is to be happy.  Modes of Intervention:  Support  Summary of Progress/Problems:  Kyle Mcmillan 01/22/2020, 8:49 PM

## 2020-01-22 NOTE — Plan of Care (Signed)
Pt denies depression, anxiety, SI, HI and AVH. Pt was educated on care plan and verbalizes understanding. Torrie Mayers RN Problem: Education: Goal: Utilization of techniques to improve thought processes will improve Outcome: Progressing Goal: Knowledge of the prescribed therapeutic regimen will improve Outcome: Progressing   Problem: Activity: Goal: Interest or engagement in leisure activities will improve Outcome: Progressing Goal: Imbalance in normal sleep/wake cycle will improve Outcome: Progressing   Problem: Coping: Goal: Coping ability will improve Outcome: Progressing Goal: Will verbalize feelings Outcome: Progressing   Problem: Health Behavior/Discharge Planning: Goal: Ability to make decisions will improve Outcome: Progressing Goal: Compliance with therapeutic regimen will improve Outcome: Progressing   Problem: Role Relationship: Goal: Will demonstrate positive changes in social behaviors and relationships Outcome: Progressing   Problem: Safety: Goal: Ability to disclose and discuss suicidal ideas will improve Outcome: Progressing Goal: Ability to identify and utilize support systems that promote safety will improve Outcome: Progressing   Problem: Self-Concept: Goal: Will verbalize positive feelings about self Outcome: Progressing Goal: Level of anxiety will decrease Outcome: Progressing
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
## 2020-01-23 NOTE — Plan of Care (Signed)
  Problem: Education: Goal: Utilization of techniques to improve thought processes will improve Outcome: Progressing Goal: Knowledge of the prescribed therapeutic regimen will improve Outcome: Progressing   Problem: Activity: Goal: Interest or engagement in leisure activities will improve Outcome: Progressing Goal: Imbalance in normal sleep/wake cycle will improve Outcome: Progressing   

## 2020-01-23 NOTE — Progress Notes (Signed)
Patient has been pleasant calm and cooperative. Denies SI, HI and AVH

## 2020-01-23 NOTE — [facility] Group Notes (Signed)
   LCSW Group Therapy Note     01/23/2020 9:30 AM- 10:07 AM      Type of Therapy and Topic:  Group Therapy:  Overcoming Obstacles     Participation Level:  Active     Description of Group:     In this group patients will be encouraged to explore what they see as obstacles to their own wellness and recovery. They will be guided to discuss their thoughts, feelings, and behaviors related to these obstacles. The group will process together ways to cope with barriers, with attention given to specific choices patients can make. Each patient will be challenged to identify changes they are motivated to make in order to overcome their obstacles. This group will be process-oriented, with patients participating in exploration of their own experiences as well as giving and receiving support and challenge from other group members.     Therapeutic Goals:  1.    Patient will identify personal and current obstacles as they relate to admission.  2.    Patient will identify barriers that currently interfere with their wellness or overcoming obstacles.  3.    Patient will identify feelings, thought process and behaviors related to these barriers.  4.    Patient will identify two changes they are willing to make to overcome these obstacles:        Summary of Patient Progress: Patient checked into group feeling good. Patient stated that he wants to work on not pulling himself back into the place he was in before coming to the hospital. Patient plans on not being around the same people and focusing on his family. Patient stated that a coping skill he uses is listening to music.         Therapeutic Modalities:    Cognitive Behavioral Therapy  Solution Focused Therapy  Motivational Interviewing  Relapse Prevention Therapy    Susa Simmonds, LCSWA   01/23/2020

## 2020-01-23 NOTE — Progress Notes (Signed)
D: Pt alert and oriented. Pt rates depression 0/10, hopelessness 0/10, and anxiety 0/10. Pt goal: "Working on going home to be with my kids." Pt reports energy level as normal and concentration as being good. Pt reports sleep last night as being good. Pt did not receive medications for sleep. Pt denies experiencing any pain at this time. Pt denies experiencing any SI/HI, or AVH at this time.   Pt voices readiness to discharge back to his children. Pt showed interest in his medication and when it's given. Pt shares his going to put his medication time in his phone as well as on the fridge and other places that he frequently goes to so that he doesn't forget to take his medications at the right time. Pt understands that it's important to take his medication and that they're taken at the correct time.   A: Scheduled medications administered to pt, per MD orders. Support and encouragement provided. Frequent verbal contact made. Routine safety checks conducted q15 minutes.   R: No adverse drug reactions noted. Pt verbally contracts for safety at this time. Pt complaint with medications and treatment plan. Pt interacts well with others on the unit. Pt remains safe at this time. Will continue to monitor.

## 2020-01-24 MED ORDER — MIRTAZAPINE 15 MG PO TABS: 15.0000 mg | ORAL_TABLET | Freq: Every day | ORAL | 1 refills | Status: DC

## 2020-01-24 MED ORDER — BUSPIRONE HCL 5 MG PO TABS: 5.0000 mg | ORAL_TABLET | Freq: Three times a day (TID) | ORAL | 1 refills | Status: DC

## 2020-01-24 NOTE — Plan of Care (Signed)
  Problem: Education: Goal: Utilization of techniques to improve thought processes will improve Outcome: Progressing Goal: Knowledge of the prescribed therapeutic regimen will improve Outcome: Progressing   Problem: Activity: Goal: Interest or engagement in leisure activities will improve Outcome: Progressing Goal: Imbalance in normal sleep/wake cycle will improve Outcome: Progressing   Problem: Coping: Goal: Coping ability will improve Outcome: Progressing Goal: Will verbalize feelings Outcome: Progressing   Problem: Health Behavior/Discharge Planning: Goal: Ability to make decisions will improve Outcome: Progressing Goal: Compliance with therapeutic regimen will improve Outcome: Progressing   Problem: Role Relationship: Goal: Will demonstrate positive changes in social behaviors and relationships Outcome: Progressing   Problem: Safety: Goal: Ability to disclose and discuss suicidal ideas will improve Outcome: Progressing Goal: Ability to identify and utilize support systems that promote safety will improve Outcome: Progressing   Problem: Self-Concept: Goal: Will verbalize positive feelings about self Outcome: Progressing Goal: Level of anxiety will decrease Outcome: Progressing   Problem: Education: Goal: Utilization of techniques to improve thought processes will improve Outcome: Progressing Goal: Knowledge of the prescribed therapeutic regimen will improve Outcome: Progressing   Problem: Activity: Goal: Interest or engagement in leisure activities will improve Outcome: Progressing Goal: Imbalance in normal sleep/wake cycle will improve Outcome: Progressing   Problem: Coping: Goal: Coping ability will improve Outcome: Progressing Goal: Will verbalize feelings Outcome: Progressing   Problem: Health Behavior/Discharge Planning: Goal: Ability to make decisions will improve Outcome: Progressing Goal: Compliance with therapeutic regimen will  improve Outcome: Progressing   Problem: Role Relationship: Goal: Will demonstrate positive changes in social behaviors and relationships Outcome: Progressing   Problem: Safety: Goal: Ability to disclose and discuss suicidal ideas will improve Outcome: Progressing Goal: Ability to identify and utilize support systems that promote safety will improve Outcome: Progressing   Problem: Self-Concept: Goal: Will verbalize positive feelings about self Outcome: Progressing Goal: Level of anxiety will decrease Outcome: Progressing   Problem: Education: Goal: Ability to make informed decisions regarding treatment will improve Outcome: Progressing   Problem: Coping: Goal: Coping ability will improve Outcome: Progressing   Problem: Health Behavior/Discharge Planning: Goal: Identification of resources available to assist in meeting health care needs will improve Outcome: Progressing   Problem: Medication: Goal: Compliance with prescribed medication regimen will improve Outcome: Progressing   Problem: Self-Concept: Goal: Ability to disclose and discuss suicidal ideas will improve Outcome: Progressing Goal: Will verbalize positive feelings about self Outcome: Progressing   Problem: Education: Goal: Knowledge of [facility] Education information/materials will improve Outcome: Progressing Goal: Emotional status will improve Outcome: Progressing Goal: Mental status will improve Outcome: Progressing Goal: Verbalization of understanding the information provided will improve Outcome: Progressing   Problem: Activity: Goal: Interest or engagement in activities will improve Outcome: Progressing Goal: Sleeping patterns will improve Outcome: Progressing   Problem: Coping: Goal: Ability to verbalize frustrations and anger appropriately will improve Outcome: Progressing Goal: Ability to demonstrate self-control will improve Outcome: Progressing   Problem: Health  Behavior/Discharge Planning: Goal: Identification of resources available to assist in meeting health care needs will improve Outcome: Progressing Goal: Compliance with treatment plan for underlying cause of condition will improve Outcome: Progressing   Problem: Physical Regulation: Goal: Ability to maintain clinical measurements within normal limits will improve Outcome: Progressing   Problem: Safety: Goal: Periods of time without injury will increase Outcome: Progressing

## 2020-01-24 NOTE — Progress Notes (Signed)
°  [facility] Adult Case Management Discharge Plan :  Will you be returning to the same living situation after discharge:  Yes,  pt reports that he is returning home.  At discharge, do you have transportation home?: Yes,  pt reports that his father will provide transportation. Do you have the ability to pay for your medications: Yes,  [facility] Medicaid.  Release of information consent forms completed and in the chart;  Patient's signature needed at discharge.  Patient to Follow up at:  Follow-up Information    [facility], [facility] Follow up.   Why: They do not schedule first time appointments, walk ins are from 9-4 Monday through Friday, thanks! Contact information: [address] [place] [address] [phone]               Next level of care provider has access to [facility] Link:no  Safety Planning and Suicide Prevention discussed: Yes,  SPE completed with the patient.  Pt declined collateral contact.  Have you used any form of tobacco in the last 30 days? (Cigarettes, Smokeless Tobacco, Cigars, and/or Pipes): No  Has patient been referred to the Quitline?: Patient refused referral  Patient has been referred for addiction treatment: Pt. refused referral  Harden Mo, LCSW 01/24/2020, 9:46 AM
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
## 2020-01-24 NOTE — Progress Notes (Signed)
Patient has been pleasant and cooperative. Bright affect. Looking forward to discharge. Denies SI, HI and AVH
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
## 2020-01-24 NOTE — Progress Notes (Signed)
Pt denies SI, HI and AVH. Pt was educated on dc plan and verbalizes understanding.Pt received belongings and dc packet. Jakub Debold RN 

## 2020-01-24 NOTE — Plan of Care (Signed)
  Problem: Coping Skills Goal: STG - Patient will identify 3 positive coping skills strategies to use post d/c within 5 recreation therapy group sessions Description: STG - Patient will identify 3 positive coping skills strategies to use post d/c within 5 recreation therapy group sessions Outcome: Completed/Met

## 2020-01-24 NOTE — Progress Notes (Signed)
Recreation Therapy Notes  INPATIENT RECREATION TR PLAN  Patient Details Name: Kyle Mcmillan MRN: [id number] DOB: 1985/06/13 Today's Date: 01/24/2020  Rec Therapy Plan Is patient appropriate for Therapeutic Recreation?: Yes Treatment times per week: at least 3 Estimated Length of Stay: 5-7 days TR Treatment/Interventions: Group participation (Comment)  Discharge Criteria Pt will be discharged from therapy if:: Discharged Treatment plan/goals/alternatives discussed and agreed upon by:: Patient/family  Discharge Summary Short term goals set: Patient will identify 3 positive coping skills strategies to use post d/c within 5 recreation therapy group sessions Short term goals met: Complete Progress toward goals comments: Groups attended Which groups?: Communication, AAA/T, Other (Comment) (Relaxation) Reason goals not met: N/A Therapeutic equipment acquired: N/A Reason patient discharged from therapy: Discharge from hospital Pt/family agrees with progress & goals achieved: Yes Date patient discharged from therapy: 01/24/20   Emry Barbato 01/24/2020, 12:01 PM

## 2020-01-24 NOTE — Plan of Care (Signed)
Pt denies depression, anxiety, SI, HI and AVH. Pt was educated on care plan and verbalizes understanding. Torrie Mayers RN Problem: Education: Goal: Utilization of techniques to improve thought processes will improve Outcome: Adequate for Discharge Goal: Knowledge of the prescribed therapeutic regimen will improve Outcome: Adequate for Discharge   Problem: Activity: Goal: Interest or engagement in leisure activities will improve Outcome: Adequate for Discharge Goal: Imbalance in normal sleep/wake cycle will improve Outcome: Adequate for Discharge   Problem: Coping: Goal: Coping ability will improve Outcome: Adequate for Discharge Goal: Will verbalize feelings Outcome: Adequate for Discharge   Problem: Health Behavior/Discharge Planning: Goal: Ability to make decisions will improve Outcome: Adequate for Discharge Goal: Compliance with therapeutic regimen will improve Outcome: Adequate for Discharge   Problem: Role Relationship: Goal: Will demonstrate positive changes in social behaviors and relationships Outcome: Adequate for Discharge   Problem: Safety: Goal: Ability to disclose and discuss suicidal ideas will improve Outcome: Adequate for Discharge Goal: Ability to identify and utilize support systems that promote safety will improve Outcome: Adequate for Discharge   Problem: Self-Concept: Goal: Will verbalize positive feelings about self Outcome: Adequate for Discharge Goal: Level of anxiety will decrease Outcome: Adequate for Discharge   Problem: Education: Goal: Utilization of techniques to improve thought processes will improve Outcome: Adequate for Discharge Goal: Knowledge of the prescribed therapeutic regimen will improve Outcome: Adequate for Discharge   Problem: Activity: Goal: Interest or engagement in leisure activities will improve Outcome: Adequate for Discharge Goal: Imbalance in normal sleep/wake cycle will improve Outcome: Adequate for Discharge    Problem: Coping: Goal: Coping ability will improve Outcome: Adequate for Discharge Goal: Will verbalize feelings Outcome: Adequate for Discharge   Problem: Health Behavior/Discharge Planning: Goal: Ability to make decisions will improve Outcome: Adequate for Discharge Goal: Compliance with therapeutic regimen will improve Outcome: Adequate for Discharge   Problem: Role Relationship: Goal: Will demonstrate positive changes in social behaviors and relationships Outcome: Adequate for Discharge   Problem: Safety: Goal: Ability to disclose and discuss suicidal ideas will improve Outcome: Adequate for Discharge Goal: Ability to identify and utilize support systems that promote safety will improve Outcome: Adequate for Discharge   Problem: Self-Concept: Goal: Will verbalize positive feelings about self Outcome: Adequate for Discharge Goal: Level of anxiety will decrease Outcome: Adequate for Discharge   Problem: Education: Goal: Ability to make informed decisions regarding treatment will improve Outcome: Adequate for Discharge   Problem: Coping: Goal: Coping ability will improve Outcome: Adequate for Discharge   Problem: Health Behavior/Discharge Planning: Goal: Identification of resources available to assist in meeting health care needs will improve Outcome: Adequate for Discharge   Problem: Medication: Goal: Compliance with prescribed medication regimen will improve Outcome: Adequate for Discharge   Problem: Self-Concept: Goal: Ability to disclose and discuss suicidal ideas will improve Outcome: Adequate for Discharge Goal: Will verbalize positive feelings about self Outcome: Adequate for Discharge   Problem: Education: Goal: Knowledge of [facility] Education information/materials will improve Outcome: Adequate for Discharge Goal: Emotional status will improve Outcome: Adequate for Discharge Goal: Mental status will improve Outcome: Adequate for  Discharge Goal: Verbalization of understanding the information provided will improve Outcome: Adequate for Discharge   Problem: Activity: Goal: Interest or engagement in activities will improve Outcome: Adequate for Discharge Goal: Sleeping patterns will improve Outcome: Adequate for Discharge   Problem: Coping: Goal: Ability to verbalize frustrations and anger appropriately will improve Outcome: Adequate for Discharge Goal: Ability to demonstrate self-control will improve Outcome: Adequate for Discharge  Problem: Health Behavior/Discharge Planning: Goal: Identification of resources available to assist in meeting health care needs will improve Outcome: Adequate for Discharge Goal: Compliance with treatment plan for underlying cause of condition will improve Outcome: Adequate for Discharge   Problem: Physical Regulation: Goal: Ability to maintain clinical measurements within normal limits will improve Outcome: Adequate for Discharge   Problem: Safety: Goal: Periods of time without injury will increase Outcome: Adequate for Discharge

## 2020-01-24 NOTE — Progress Notes (Signed)
Recreation Therapy Notes  Date: 01/24/2020  Time: 9:30 am    Location: Craft room   Behavioral response: Appropriate  Intervention Topic: Relaxation  Discussion/Intervention:  Group content today was focused on relaxation. The group defined relaxation and identified healthy ways to relax. Individuals expressed how much time they spend relaxing. Patients expressed how much their life would be if they did not make time for themselves to relax. The group stated ways they could improve their relaxation techniques in the future.  Individuals participated in the intervention "Time to Relax" where they had a chance to experience different relaxation techniques.  Clinical Observations/Feedback: Patient came to group and was focused on what peers and staff had to say about communication. Individual was social with peers and staff while participating in the intervention.  Tine Mabee LRT/CTRS         Kyle Mcmillan 01/24/2020 11:29 AM

## 2020-02-22 ENCOUNTER — Other Ambulatory Visit: Payer: Self-pay | Admitting: Psychiatry

## 2020-02-23 ENCOUNTER — Other Ambulatory Visit: Payer: Self-pay | Admitting: Psychiatry

## 2020-04-28 ENCOUNTER — Encounter: Payer: Self-pay | Admitting: Emergency Medicine

## 2020-04-28 ENCOUNTER — Ambulatory Visit
Admission: EM | Admit: 2020-04-28 | Discharge: 2020-04-28 | Disposition: A | Discharge status: Home / Self Care | Payer: Medicaid Other | Attending: Emergency Medicine | Admitting: Emergency Medicine

## 2020-04-28 ENCOUNTER — Other Ambulatory Visit: Payer: Self-pay

## 2020-04-28 ENCOUNTER — Ambulatory Visit (INDEPENDENT_AMBULATORY_CARE_PROVIDER_SITE_OTHER): Payer: Medicaid Other

## 2020-04-28 DIAGNOSIS — M7989 Other specified soft tissue disorders: Secondary | ICD-10-CM | POA: Diagnosis not present

## 2020-04-28 DIAGNOSIS — T148XXA Other injury of unspecified body region, initial encounter: Secondary | ICD-10-CM | POA: Diagnosis not present

## 2020-04-28 DIAGNOSIS — W2201XA Walked into wall, initial encounter: Secondary | ICD-10-CM | POA: Diagnosis not present

## 2020-04-28 DIAGNOSIS — S62316A Displaced fracture of base of fifth metacarpal bone, right hand, initial encounter for closed fracture: Secondary | ICD-10-CM

## 2020-04-28 DIAGNOSIS — M79641 Pain in right hand: Secondary | ICD-10-CM

## 2020-04-28 HISTORY — DX: Anxiety disorder, unspecified: F41.9

## 2020-04-28 MED ORDER — IBUPROFEN 800 MG PO TABS: 800.0000 mg | ORAL_TABLET | Freq: Three times a day (TID) | ORAL | 0 refills | Status: DC | PRN

## 2020-04-28 NOTE — Discharge Instructions (Addendum)
Prescribed ibuprofen as needed for pain Follow RICE instructions as attached Follow-up with PCP Return or go to ED if you develop any new or worsening of your symptoms

## 2020-04-28 NOTE — ED Triage Notes (Signed)
Punched wall about 4 hours ago.  Hand swollen and bruised

## 2020-04-28 NOTE — ED Provider Notes (Signed)
[facility]   [id number] 04/28/20 Arrival Time: 1923   Chief Complaint  Patient presents with  . Hand Pain     SUBJECTIVE: History from: patient.  Kyle Mcmillan is a 35 y.o. male who presented to the urgent care with a complaint of right hand pain and swelling that occurred 4 hours ago.  Developed the symptom after punching a wall.  He localizes the pain to the right hand.  He describes the pain as constant and achy.  He has tried OTC medications without relief.  His symptoms are made worse with ROM.  He denies similar symptoms in the past.  Denies chills, fever, nausea, vomiting, diarrhea   ROS: As per HPI.  All other pertinent ROS negative.     Past Medical History:  Diagnosis Date  . Anxiety   . Bronchitis    History reviewed. No pertinent surgical history. Allergies  Allergen Reactions  . Penicillins   . Phenobarbital   . Tetanus Toxoids    No current facility-administered medications on file prior to encounter.   Current Outpatient Medications on File Prior to Encounter  Medication Sig Dispense Refill  . busPIRone (BUSPAR) 5 MG tablet Take 1 tablet (5 mg total) by mouth 3 (three) times daily. 90 tablet 1  . mirtazapine (REMERON) 15 MG tablet Take 1 tablet (15 mg total) by mouth at bedtime. 30 tablet 1   Social History   Socioeconomic History  . Marital status: Married    Spouse name: Not on file  . Number of children: Not on file  . Years of education: Not on file  . Highest education level: Not on file  Occupational History  . Not on file  Tobacco Use  . Smoking status: Never Smoker  . Smokeless tobacco: Never Used  Substance and Sexual Activity  . Alcohol use: No  . Drug use: Not on file  . Sexual activity: Not on file  Other Topics Concern  . Not on file  Social History Narrative  . Not on file   Social Determinants of Health   Financial Resource Strain: Not on file  Food Insecurity: Not on file  Transportation Needs: Not on file   Physical Activity: Not on file  Stress: Not on file  Social Connections: Not on file  Intimate Partner Violence: Not on file   History reviewed. No pertinent family history.  OBJECTIVE:  Vitals:   04/28/20 1929 04/28/20 1930  BP: 130/85   Pulse: (!) 104   Resp: 18   Temp: 98.6 F (37 C)   TempSrc: Oral   SpO2: 98%   Weight:  228 lb (103.4 kg)  Height:  5\' 11"  (1.803 m)     Physical Exam Vitals and nursing note reviewed.  Constitutional:      General: He is not in acute distress.    Appearance: Normal appearance. He is normal weight. He is not ill-appearing, toxic-appearing or diaphoretic.  Cardiovascular:     Rate and Rhythm: Normal rate and regular rhythm.     Pulses: Normal pulses.     Heart sounds: Normal heart sounds. No murmur heard. No friction rub. No gallop.   Pulmonary:     Effort: Pulmonary effort is normal. No respiratory distress.     Breath sounds: Normal breath sounds. No stridor. No wheezing, rhonchi or rales.  Chest:     Chest wall: No tenderness.  Musculoskeletal:     Right hand: Swelling and tenderness present.     Left hand: Normal.  Comments: The right hand is with obvious deformity when compared to the left hand.  Swelling, tenderness and ecchymosis present.  There is no lesion, open wound, subungual hematoma present.  Limited range of motion due to pain.  Neurovascular status intact.  Neurological:     Mental Status: He is alert and oriented to person, place, and time.      LABS:  No results found for this or any previous visit (from the past 24 hour(s)).    RADIOLOGY:  DG Hand Complete Right  Result Date: 04/28/2020 CLINICAL DATA:  Punched wall with pain and swelling EXAM: RIGHT HAND - COMPLETE 3+ VIEW COMPARISON:  08/24/2007 FINDINGS: Acute mildly comminuted intra-articular fracture involving base of fifth metacarpal with suspected dorsal displacement of distal fracture fragment. Positive for soft tissue swelling. No subluxation  IMPRESSION: Acute comminuted intra-articular fracture involving the base of the fifth metacarpal. Electronically Signed   By: Jasmine Pang M.D.   On: 04/28/2020 19:48     Right hand X-ray is positive for acute  Comminuted intra-articular fracture involving the base of the fifth metatarsal.  I have reviewed the x-ray myself and the radiologist interpretation.  I am in agreement with the radiologist interpretation.   ASSESSMENT & PLAN:  1. Right hand pain   2. Swelling of right hand   3. Comminuted fracture   4. Closed displaced fracture of base of fifth metacarpal bone of right hand, initial encounter     Meds ordered this encounter  Medications  . ibuprofen (ADVIL) 800 MG tablet    Sig: Take 1 tablet (800 mg total) by mouth 3 (three) times daily as needed. Take it with food    Dispense:  30 tablet    Refill:  0    Discharge instruction  Prescribed ibuprofen as needed for pain Follow RICE instructions as attached Follow-up with PCP Return or go to ED if you develop any new or worsening of your symptoms  Reviewed expectations re: course of current medical issues. Questions answered. Outlined signs and symptoms indicating need for more acute intervention. Patient verbalized understanding. After Visit Summary given.         Durward Parcel, FNP 04/28/20 2000

## 2020-08-03 ENCOUNTER — Ambulatory Visit
Admission: EM | Admit: 2020-08-03 | Discharge: 2020-08-03 | Disposition: A | Discharge status: Home / Self Care | Payer: Medicaid Other | Attending: Emergency Medicine | Admitting: Emergency Medicine

## 2020-08-03 ENCOUNTER — Other Ambulatory Visit: Payer: Self-pay

## 2020-08-03 DIAGNOSIS — K047 Periapical abscess without sinus: Secondary | ICD-10-CM

## 2020-08-03 DIAGNOSIS — K0889 Other specified disorders of teeth and supporting structures: Secondary | ICD-10-CM

## 2020-08-03 MED ORDER — CHLORHEXIDINE GLUCONATE 0.12 % MT SOLN: 15.0000 mL | Freq: Two times a day (BID) | OROMUCOSAL | 0 refills | Status: DC

## 2020-08-03 MED ORDER — MELOXICAM 15 MG PO TABS: 15.0000 mg | ORAL_TABLET | Freq: Every day | ORAL | 0 refills | Status: DC

## 2020-08-03 NOTE — ED Provider Notes (Signed)
[facility] CARE CENTER   [id number] 08/03/20 Arrival Time: 1414  CC: DENTAL PAIN  SUBJECTIVE:  Kyle Mcmillan is a 35 y.o. male who presents with RT sided dental pain x 3 days.  Denies a precipitating event or trauma.  Localizes pain to RT lower jaw.  Has tried OTC analgesics without relief.  Worse with chewing.  Does not see a dentist regularly.  Reports similar symptoms in the past.  Denies fever, chills, dysphagia, odynophagia, oral or neck swelling, nausea, vomiting, chest pain, SOB.    ROS: As per HPI.  All other pertinent ROS negative.     Past Medical History:  Diagnosis Date  . Anxiety   . Bronchitis    History reviewed. No pertinent surgical history. Allergies  Allergen Reactions  . Penicillins   . Phenobarbital   . Tetanus Toxoids    No current facility-administered medications on file prior to encounter.   Current Outpatient Medications on File Prior to Encounter  Medication Sig Dispense Refill  . busPIRone (BUSPAR) 5 MG tablet Take 1 tablet (5 mg total) by mouth 3 (three) times daily. 90 tablet 1  . mirtazapine (REMERON) 15 MG tablet Take 1 tablet (15 mg total) by mouth at bedtime. 30 tablet 1   Social History   Socioeconomic History  . Marital status: Married    Spouse name: Not on file  . Number of children: Not on file  . Years of education: Not on file  . Highest education level: Not on file  Occupational History  . Not on file  Tobacco Use  . Smoking status: Never Smoker  . Smokeless tobacco: Never Used  Substance and Sexual Activity  . Alcohol use: No  . Drug use: Not on file  . Sexual activity: Not on file  Other Topics Concern  . Not on file  Social History Narrative  . Not on file   Social Determinants of Health   Financial Resource Strain: Not on file  Food Insecurity: Not on file  Transportation Needs: Not on file  Physical Activity: Not on file  Stress: Not on file  Social Connections: Not on file  Intimate Partner Violence: Not on  file   Family History  Family history unknown: Yes    OBJECTIVE:  Vitals:   08/03/20 1447  BP: 135/83  Pulse: 75  Resp: 20  Temp: 98 F (36.7 C)  SpO2: 95%    General appearance: alert; no distress HENT: normocephalic; atraumatic; dentition: fair; dental caries back bottom molar, TTP, n obvious gingival swelling, erythema or abscess Neck: supple without LAD Lungs: normal respirations Skin: warm and dry Psychological: alert and cooperative; normal mood and affect  ASSESSMENT & PLAN:  1. Pain, dental   2. Dental infection     Meds ordered this encounter  Medications  . meloxicam (MOBIC) 15 MG tablet    Sig: Take 1 tablet (15 mg total) by mouth daily.    Dispense:  20 tablet    Refill:  0    Order Specific Question:   Supervising Provider    Answer:   Eustace Moore [[id number]]  . chlorhexidine (PERIDEX) 0.12 % solution    Sig: Use as directed 15 mLs in the mouth or throat 2 (two) times daily.    Dispense:  473 mL    Refill:  0    Order Specific Question:   Supervising Provider    Answer:   Eustace Moore [[id number]]   Mobic and peridex for pain Clindamycin for infection  Recommend soft diet until evaluated by dentist Maintain oral hygiene care Follow up with dentist as soon as possible for further evaluation and treatment  Return or go to the ED if you have any new or worsening symptoms such as fever, chills, difficulty swallowing, painful swallowing, oral or neck swelling, nausea, vomiting, chest pain, SOB, etc...  Reviewed expectations re: course of current medical issues. Questions answered. Outlined signs and symptoms indicating need for more acute intervention. Patient verbalized understanding. After Visit Summary given.   Rennis Harding, PA-C 08/03/20 1526

## 2020-08-03 NOTE — Discharge Instructions (Signed)
  Mobic and peridex for pain Clindamycin for infection Recommend soft diet until evaluated by dentist Maintain oral hygiene care Follow up with dentist as soon as possible for further evaluation and treatment  Return or go to the ED if you have any new or worsening symptoms such as fever, chills, difficulty swallowing, painful swallowing, oral or neck swelling, nausea, vomiting, chest pain, SOB, etc..Marland Kitchen

## 2020-08-03 NOTE — ED Triage Notes (Signed)
Pt presents with right dental pain for past 3 days

## 2020-10-05 ENCOUNTER — Emergency Department (HOSPITAL_COMMUNITY)
Admission: EM | Admit: 2020-10-05 | Discharge: 2020-10-05 | Discharge status: Against Medical Advice | Payer: Medicaid Other | Attending: Emergency Medicine | Admitting: Emergency Medicine

## 2020-10-05 ENCOUNTER — Other Ambulatory Visit: Payer: Self-pay

## 2020-10-05 NOTE — ED Notes (Signed)
Not visualized in the lobby .

## 2020-10-05 NOTE — ED Notes (Signed)
Pt still not visualized in the lobby.

## 2020-11-17 ENCOUNTER — Ambulatory Visit
Admission: EM | Admit: 2020-11-17 | Discharge: 2020-11-17 | Disposition: A | Discharge status: Home / Self Care | Payer: Medicaid Other | Attending: Emergency Medicine | Admitting: Emergency Medicine

## 2020-11-17 ENCOUNTER — Other Ambulatory Visit: Payer: Self-pay

## 2020-11-17 ENCOUNTER — Encounter: Payer: Self-pay | Admitting: Emergency Medicine

## 2020-11-17 DIAGNOSIS — K0889 Other specified disorders of teeth and supporting structures: Secondary | ICD-10-CM | POA: Diagnosis not present

## 2020-11-17 DIAGNOSIS — K047 Periapical abscess without sinus: Secondary | ICD-10-CM

## 2020-11-17 MED ORDER — CLINDAMYCIN HCL 300 MG PO CAPS: 300.0000 mg | ORAL_CAPSULE | Freq: Four times a day (QID) | ORAL | 0 refills | Status: AC

## 2020-11-17 MED ORDER — CHLORHEXIDINE GLUCONATE 0.12 % MT SOLN: 15.0000 mL | Freq: Two times a day (BID) | OROMUCOSAL | 0 refills | Status: DC

## 2020-11-17 NOTE — ED Provider Notes (Signed)
[facility] CARE CENTER   [id number] 11/17/20 Arrival Time: 1201  CC: DENTAL PAIN  SUBJECTIVE:  Kyle Mcmillan is a 35 y.o. male who presents with dental pain x 1 week.  Denies a precipitating event or trauma.  Localizes pain to back molars.  Has tried OTC analgesics without relief.  Worse with chewing.  Does not see a dentist regularly.  Reports similar symptoms in the past.  Denies fever, chills, dysphagia, odynophagia, oral or neck swelling, nausea, vomiting, chest pain, SOB.    ROS: As per HPI.  All other pertinent ROS negative.     Past Medical History:  Diagnosis Date   Anxiety    Bronchitis    History reviewed. No pertinent surgical history. Allergies  Allergen Reactions   Ibuprofen Swelling   Penicillins    Phenobarbital    Tetanus Toxoids    No current facility-administered medications on file prior to encounter.   Current Outpatient Medications on File Prior to Encounter  Medication Sig Dispense Refill   busPIRone (BUSPAR) 5 MG tablet Take 1 tablet (5 mg total) by mouth 3 (three) times daily. 90 tablet 1   mirtazapine (REMERON) 15 MG tablet Take 1 tablet (15 mg total) by mouth at bedtime. 30 tablet 1   Social History   Socioeconomic History   Marital status: Married    Spouse name: Not on file   Number of children: Not on file   Years of education: Not on file   Highest education level: Not on file  Occupational History   Not on file  Tobacco Use   Smoking status: Never   Smokeless tobacco: Never  Substance and Sexual Activity   Alcohol use: No   Drug use: Not on file   Sexual activity: Not on file  Other Topics Concern   Not on file  Social History Narrative   Not on file   Social Determinants of Health   Financial Resource Strain: Not on file  Food Insecurity: Not on file  Transportation Needs: Not on file  Physical Activity: Not on file  Stress: Not on file  Social Connections: Not on file  Intimate Partner Violence: Not on file   Family  History  Family history unknown: Yes    OBJECTIVE:  Vitals:   11/17/20 1221  BP: (!) 143/82  Pulse: 84  Resp: 18  Temp: 98.4 F (36.9 C)  TempSrc: Oral  SpO2: 98%    General appearance: alert; no distress HENT: normocephalic; atraumatic; dentition: fair;  partial tooth avulsion to bilateral back molars Neck: supple without LAD Lungs: normal respirations Skin: warm and dry Psychological: alert and cooperative; normal mood and affect  ASSESSMENT & PLAN:  1. Pain, dental   2. Dental infection     Meds ordered this encounter  Medications   chlorhexidine (PERIDEX) 0.12 % solution    Sig: Use as directed 15 mLs in the mouth or throat 2 (two) times daily.    Dispense:  473 mL    Refill:  0    Order Specific Question:   Supervising Provider    Answer:   Eustace Moore [[id number]]   clindamycin (CLEOCIN) 300 MG capsule    Sig: Take 1 capsule (300 mg total) by mouth every 6 (six) hours for 10 days.    Dispense:  40 capsule    Refill:  0    Order Specific Question:   Supervising Provider    Answer:   Eustace Moore [[id number]]    Clindamycin prescribed.  Take as directed and to completion Peridex for pain Maintain oral hygiene care Follow up with dentist as soon as possible for further evaluation and treatment  Return or go to the ED if you have any new or worsening symptoms such as fever, chills, difficulty swallowing, painful swallowing, oral or neck swelling, nausea, vomiting, chest pain, SOB, etc...  Reviewed expectations re: course of current medical issues. Questions answered. Outlined signs and symptoms indicating need for more acute intervention. Patient verbalized understanding. After Visit Summary given.    Rennis Harding, PA-C 11/17/20 1248

## 2020-11-17 NOTE — Discharge Instructions (Signed)
Clindamycin prescribed.  Take as directed and to completion Peridex for pain Maintain oral hygiene care Follow up with dentist as soon as possible for further evaluation and treatment  Return or go to the ED if you have any new or worsening symptoms such as fever, chills, difficulty swallowing, painful swallowing, oral or neck swelling, nausea, vomiting, chest pain, SOB, etc..Marland Kitchen

## 2020-11-17 NOTE — ED Triage Notes (Signed)
Dental pain to RT and LT side x 1 week.  Taking naproxen with no relief.

## 2021-03-26 ENCOUNTER — Emergency Department
Admission: EM | Admit: 2021-03-26 | Discharge: 2021-03-26 | Disposition: A | Discharge status: Home / Self Care | Payer: Medicaid Other | Attending: Emergency Medicine | Admitting: Emergency Medicine

## 2021-03-26 ENCOUNTER — Other Ambulatory Visit: Payer: Self-pay

## 2021-03-26 DIAGNOSIS — K0889 Other specified disorders of teeth and supporting structures: Secondary | ICD-10-CM | POA: Diagnosis not present

## 2021-03-26 DIAGNOSIS — J3489 Other specified disorders of nose and nasal sinuses: Secondary | ICD-10-CM | POA: Insufficient documentation

## 2021-03-26 MED ORDER — FLUTICASONE PROPIONATE 50 MCG/ACT NA SUSP: 1.0000 | Freq: Every day | NASAL | 0 refills | Status: DC

## 2021-03-26 MED ORDER — PSEUDOEPHEDRINE HCL 60 MG PO TABS: 60.0000 mg | ORAL_TABLET | Freq: Four times a day (QID) | ORAL | 0 refills | Status: DC | PRN

## 2021-03-26 MED ORDER — CHLORHEXIDINE GLUCONATE 0.12 % MT SOLN: 15.0000 mL | Freq: Two times a day (BID) | OROMUCOSAL | 0 refills | Status: DC

## 2021-03-26 MED ORDER — CEFDINIR 300 MG PO CAPS: 300.0000 mg | ORAL_CAPSULE | Freq: Two times a day (BID) | ORAL | 0 refills | Status: AC
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
## 2021-03-26 NOTE — ED Triage Notes (Signed)
Pt c/o BL lower tooth ache and right upper tooth pain intermittently since august and has been on abx intermittently, pt has not followed up with a dentist for the continued issue

## 2021-03-26 NOTE — ED Provider Notes (Signed)
Emergency Medicine Provider Triage Evaluation Note  Kyle Mcmillan , a 35 y.o. male  was evaluated in triage.  Pt complains of dental pain on both sides of lower jaw since August. He has taken 4 rounds of antibiotics but none have helped. No fever. No facial swelling today.  Review of Systems  Positive: Dental pain Negative: Fever  Physical Exam  There were no vitals taken for this visit. Gen:   Awake, no distress   Resp:  Normal effort  MSK:   Moves extremities without difficulty  Other:   Medical Decision Making  Medically screening exam initiated at 2:10 PM.  Appropriate orders placed.  Kyle Mcmillan was informed that the remainder of the evaluation will be completed by another provider, this initial triage assessment does not replace that evaluation, and the importance of remaining in the ED until their evaluation is complete.   Chinita Pester, FNP 03/26/21 1413    Dionne Bucy, MD 03/26/21 1901

## 2021-03-26 NOTE — Discharge Instructions (Addendum)
Take the antibiotic as prescribed.  Use the Flonase daily and take the pseudoephedrine as prescribed over the next week several days to help with her sinus and ear pressure.  We have attached a list of area dental clinics that you can call to arrange a follow-up appointment; we recommend that you follow-up within the next week.  Return to the ER for new, worsening, or persistent severe dental pain, pus drainage, fever, facial swelling, severe headache, or any other new or worsening symptoms that concern you.

## 2021-07-08 ENCOUNTER — Emergency Department
Admission: EM | Admit: 2021-07-08 | Discharge: 2021-07-09 | Disposition: A | Discharge status: Home / Self Care | Payer: Medicaid Other | Attending: Emergency Medicine | Admitting: Emergency Medicine

## 2021-07-08 ENCOUNTER — Emergency Department: Payer: Medicaid Other

## 2021-07-08 DIAGNOSIS — E860 Dehydration: Secondary | ICD-10-CM | POA: Insufficient documentation

## 2021-07-08 DIAGNOSIS — K529 Noninfective gastroenteritis and colitis, unspecified: Secondary | ICD-10-CM | POA: Insufficient documentation

## 2021-07-08 DIAGNOSIS — R109 Unspecified abdominal pain: Secondary | ICD-10-CM | POA: Diagnosis present

## 2021-07-08 LAB — CBC
HCT: 49.4 % (ref 39.0–52.0)
Hemoglobin: 16.9 g/dL (ref 13.0–17.0)
MCH: 29.3 pg (ref 26.0–34.0)
MCHC: 34.2 g/dL (ref 30.0–36.0)
MCV: 85.8 fL (ref 80.0–100.0)
Platelets: 252 10*3/uL (ref 150–400)
RBC: 5.76 MIL/uL (ref 4.22–5.81)
RDW: 12.9 % (ref 11.5–15.5)
WBC: 11.1 10*3/uL — ABNORMAL HIGH (ref 4.0–10.5)
nRBC: 0 % (ref 0.0–0.2)

## 2021-07-08 LAB — URINALYSIS, ROUTINE W REFLEX MICROSCOPIC
Bacteria, UA: NONE SEEN
Bilirubin Urine: NEGATIVE
Glucose, UA: NEGATIVE mg/dL
Hgb urine dipstick: NEGATIVE
Ketones, ur: 5 mg/dL — AB
Leukocytes,Ua: NEGATIVE
Nitrite: NEGATIVE
Protein, ur: NEGATIVE mg/dL
Specific Gravity, Urine: 1.015 (ref 1.005–1.030)
pH: 5 (ref 5.0–8.0)

## 2021-07-08 LAB — COMPREHENSIVE METABOLIC PANEL
ALT: 18 U/L (ref 0–44)
AST: 15 U/L (ref 15–41)
Albumin: 4 g/dL (ref 3.5–5.0)
Alkaline Phosphatase: 69 U/L (ref 38–126)
Anion gap: 6 (ref 5–15)
BUN: 12 mg/dL (ref 6–20)
CO2: 27 mmol/L (ref 22–32)
Calcium: 9.1 mg/dL (ref 8.9–10.3)
Chloride: 104 mmol/L (ref 98–111)
Creatinine, Ser: 1.21 mg/dL (ref 0.61–1.24)
GFR, Estimated: >60 mL/min (ref >60)
Glucose, Bld: 99 mg/dL (ref 70–99)
Potassium: 3.6 mmol/L (ref 3.5–5.1)
Sodium: 137 mmol/L (ref 135–145)
Total Bilirubin: 0.8 mg/dL (ref 0.3–1.2)
Total Protein: 7.6 g/dL (ref 6.5–8.1)

## 2021-07-08 LAB — LIPASE, BLOOD: Lipase: 31 U/L (ref 11–51)

## 2021-07-08 MED ORDER — KETOROLAC TROMETHAMINE 30 MG/ML IJ SOLN
15.0000 mg | Freq: Once | INTRAMUSCULAR | Status: AC
Start: 2021-07-09 — End: 2021-07-09
  Administered 2021-07-09: 15 mg via INTRAVENOUS
  Filled 2021-07-08: qty 1

## 2021-07-08 MED ORDER — IOHEXOL 300 MG/ML  SOLN
100.0000 mL | Freq: Once | INTRAMUSCULAR | Status: AC | PRN
  Administered 2021-07-08: 100 mL via INTRAVENOUS

## 2021-07-08 MED ORDER — LACTATED RINGERS IV BOLUS
1000.0000 mL | Freq: Once | INTRAVENOUS | Status: AC
Start: 2021-07-08 — End: 2021-07-09
  Administered 2021-07-08: 1000 mL via INTRAVENOUS

## 2021-07-08 NOTE — ED Provider Notes (Signed)
? ?[facility] ?Provider Note ? ? ? Event Date/Time  ? First MD Initiated Contact with Patient 07/08/21 2258   ?  (approximate) ? ? ?History  ? ?Emesis, Abdominal Pain, and Diarrhea ? ? ?HPI ? ?Kyle Mcmillan is a 36 y.o. male who presents to the ED for evaluation of Emesis, Abdominal Pain, and Diarrhea ?  ?Obese patient with history of anxiety and depression. ? ?Patient presents to the ED, accompanied by his fianc?e, for evaluation of nausea, emesis, diarrhea and abdominal discomfort.  He reports starting with generalized abdominal cramping in the past 3 days, associated nausea.  Reports developing recurrent diarrhea, up to 5-8 episodes per day of watery diarrhea without hematochezia or melena.  Due to ongoing symptoms and poor p.o. intake, he presents to the ED for evaluation. ? ?Denies any fevers, dysuria, syncope.  Denies recent antibiotic use ? ? ?Physical Exam  ? ?Triage Vital Signs: ?ED Triage Vitals  ?Enc Vitals Group  ?   BP 07/08/21 2038 (!) 137/100  ?   Pulse Rate 07/08/21 2038 (!) 102  ?   Resp 07/08/21 2038 20  ?   Temp 07/08/21 2038 98.7 ?F (37.1 ?C)  ?   Temp Source 07/08/21 2038 Oral  ?   SpO2 07/08/21 2038 99 %  ?   Weight 07/08/21 2041 245 lb (111.1 kg)  ?   Height --   ?   Head Circumference --   ?   Peak Flow --   ?   Pain Score 07/08/21 2041 3  ?   Pain Loc --   ?   Pain Edu? --   ?   Excl. in GC? --   ? ? ?Most recent vital signs: ?Vitals:  ? 07/08/21 2038 07/08/21 2300  ?BP: (!) 137/100 (!) 142/89  ?Pulse: (!) 102 99  ?Resp: 20 (!) 21  ?Temp: 98.7 ?F (37.1 ?C)   ?SpO2: 99% 99%  ? ? ?General: Awake, no distress.  Obese.  Flat affect.  Pleasant and conversational. ?CV:  Good peripheral perfusion.  Initially tachycardic and regular, resolving with IV fluids. ?Resp:  Normal effort.  ?Abd:  No distention.  Diffuse right-sided tenderness with voluntary guarding making deeper palpation difficult.  No clear peritoneal features.  Left-sided abdomen and lower abdomen is  benign. ?MSK:  No deformity noted.  ?Neuro:  No focal deficits appreciated. ?Other:   ? ? ?ED Results / Procedures / Treatments  ? ?Labs ?(all labs ordered are listed, but only abnormal results are displayed) ?Labs Reviewed  ?CBC - Abnormal; Notable for the following components:  ?    Result Value  ? WBC 11.1 (*)   ? All other components within normal limits  ?URINALYSIS, ROUTINE W REFLEX MICROSCOPIC - Abnormal; Notable for the following components:  ? Color, Urine YELLOW (*)   ? APPearance CLEAR (*)   ? Ketones, ur 5 (*)   ? All other components within normal limits  ?LIPASE, BLOOD  ?COMPREHENSIVE METABOLIC PANEL  ? ? ?EKG ? ? ?RADIOLOGY ?CT abdomen/pelvis reviewed by me without evidence of SBO, appendicitis or cholecystitis ? ?Official radiology report(s): ?CT ABDOMEN PELVIS W CONTRAST ? ?Result Date: 07/08/2021 ?CLINICAL DATA:  Right-sided abdominal pain for several days EXAM: CT ABDOMEN AND PELVIS WITH CONTRAST TECHNIQUE: Multidetector CT imaging of the abdomen and pelvis was performed using the standard protocol following bolus administration of intravenous contrast. RADIATION DOSE REDUCTION: This exam was performed according to the departmental dose-optimization program which includes automated exposure control,  adjustment of the mA and/or kV according to patient size and/or use of iterative reconstruction technique. CONTRAST:  OMNIPAQUE IOHEXOL 300 MG/ML  SOLN COMPARISON:  08/27/2007 FINDINGS: Lower chest: No acute abnormality. Hepatobiliary: No focal liver abnormality is seen. No gallstones, gallbladder wall thickening, or biliary dilatation. Pancreas: Unremarkable. No pancreatic ductal dilatation or surrounding inflammatory changes. Spleen: Normal in size without focal abnormality. Adrenals/Urinary Tract: Adrenal glands are within normal limits. Kidneys are within normal limits. No renal calculi or obstructive changes are noted. Bladder is partially distended. Stomach/Bowel: Fluid is noted throughout  the colon consistent with diarrheal state. No obstructive or inflammatory changes are noted. The appendix is within normal limits. No small bowel abnormality is seen. Stomach is within normal limits. Vascular/Lymphatic: No significant vascular findings are present. No enlarged abdominal or pelvic lymph nodes. Reproductive: Prostate is unremarkable. Other: No abdominal wall hernia or abnormality. No abdominopelvic ascites. Musculoskeletal: No acute or significant osseous findings. IMPRESSION: Fluid within the colon consistent with the diarrheal state. No other focal abnormality is noted. Electronically Signed   By: Alcide Clever M.D.   On: 07/08/2021 23:53   ? ?PROCEDURES and INTERVENTIONS: ? ?.1-3 Lead EKG Interpretation ?Performed by: Delton Prairie, MD ?Authorized by: Delton Prairie, MD  ? ?  Interpretation: normal   ?  ECG rate:  98 ?  ECG rate assessment: normal   ?  Rhythm: sinus rhythm   ?  Ectopy: none   ?  Conduction: normal   ? ?Medications  ?lactated ringers bolus 1,000 mL (1,000 mLs Intravenous New Bag/Given 07/08/21 2333)  ?iohexol (OMNIPAQUE) 300 MG/ML solution 100 mL (100 mLs Intravenous Contrast Given 07/08/21 2336)  ?ketorolac (TORADOL) 30 MG/ML injection 15 mg (15 mg Intravenous Given 07/09/21 0006)  ? ? ? ?IMPRESSION / MDM / ASSESSMENT AND PLAN / ED COURSE  ?I reviewed the triage vital signs and the nursing notes. ? ?36 year old male presents to the ED with a few days of nausea, emesis and diarrhea, likely due to gastroenteritis ultimately suitable for outpatient management.  Look systemically well.  Has right-sided abdominal tenderness but no peritoneal features.  Blood work with minimal leukocytosis, negative lipase and normal metabolic panel.  Urinalysis with ketonuria suggestive of dehydration, but no signs of cystitis.  CT abdomen/pelvis obtained and without evidence of diverticulitis, cholecystitis, SBO or appendicitis.  Diarrheal state and overall syndrome is most consistent with likely  gastroenteritis.  After rehydration orally and IV, Toradol he is reporting feeling much better.  We will discharge with return precautions. ? ?Clinical Course as of 07/09/21 0011  ?Mon Jul 09, 2021  ?0004 Reassessed.  Patient reports feeling okay.  We discussed reassuring CT scan and possible etiologies of his symptoms.  Discussed management at home and return precautions. [DS]  ?  ?Clinical Course User Index ?[DS] Delton Prairie, MD  ? ? ? ?FINAL CLINICAL IMPRESSION(S) / ED DIAGNOSES  ? ?Final diagnoses:  ?Dehydration  ?Gastroenteritis  ? ? ? ?Rx / DC Orders  ? ?ED Discharge Orders   ? ?      Ordered  ?  ondansetron (ZOFRAN-ODT) 4 MG disintegrating tablet  Every 8 hours PRN       ? 07/09/21 0005  ? ?  ?  ? ?  ? ? ? ?Note:  This document was prepared using Dragon voice recognition software and may include unintentional dictation errors. ?  ?Delton Prairie, MD ?07/09/21 0011 ? ?

## 2021-07-08 NOTE — ED Triage Notes (Signed)
Pt comes pov with n/v/d for 4 days. States generalized abd pain. Felt feverish but hasn't checked temp.  ?

## 2021-07-09 MED ORDER — ONDANSETRON 4 MG PO TBDP: 4.0000 mg | ORAL_TABLET | Freq: Three times a day (TID) | ORAL | 0 refills | Status: DC | PRN

## 2021-07-09 NOTE — ED Notes (Signed)
Pt given a water and saltines.  ?

## 2021-07-09 NOTE — ED Notes (Signed)
Pt tolerated PO challenge

## 2021-07-09 NOTE — Discharge Instructions (Signed)
Please take Tylenol and ibuprofen/Advil for your pain.  It is safe to take them together, or to alternate them every few hours.  Take up to 1000mg  of Tylenol at a time, up to 4 times per day.  Do not take more than 4000 mg of Tylenol in 24 hours.  For ibuprofen, take 400-600 mg, 4-5 times per day. ? ?Use the Zofran as needed for any further nausea or vomiting. ?

## 2021-08-23 ENCOUNTER — Other Ambulatory Visit: Payer: Self-pay

## 2021-08-23 ENCOUNTER — Emergency Department
Admission: EM | Admit: 2021-08-23 | Discharge: 2021-08-23 | Disposition: A | Discharge status: Home / Self Care | Payer: Medicaid Other | Attending: Emergency Medicine | Admitting: Emergency Medicine

## 2021-08-23 ENCOUNTER — Emergency Department: Payer: Medicaid Other

## 2021-08-23 ENCOUNTER — Encounter: Payer: Self-pay | Admitting: Emergency Medicine

## 2021-08-23 DIAGNOSIS — N390 Urinary tract infection, site not specified: Secondary | ICD-10-CM | POA: Insufficient documentation

## 2021-08-23 DIAGNOSIS — D72829 Elevated white blood cell count, unspecified: Secondary | ICD-10-CM | POA: Insufficient documentation

## 2021-08-23 DIAGNOSIS — R7989 Other specified abnormal findings of blood chemistry: Secondary | ICD-10-CM | POA: Diagnosis not present

## 2021-08-23 DIAGNOSIS — K8020 Calculus of gallbladder without cholecystitis without obstruction: Secondary | ICD-10-CM | POA: Diagnosis not present

## 2021-08-23 DIAGNOSIS — R1011 Right upper quadrant pain: Secondary | ICD-10-CM | POA: Diagnosis present

## 2021-08-23 LAB — URINALYSIS, ROUTINE W REFLEX MICROSCOPIC
Bilirubin Urine: NEGATIVE
Glucose, UA: NEGATIVE mg/dL
Hgb urine dipstick: NEGATIVE
Ketones, ur: 20 mg/dL — AB
Nitrite: NEGATIVE
Protein, ur: NEGATIVE mg/dL
Specific Gravity, Urine: 1.014 (ref 1.005–1.030)
WBC, UA: >50 WBC/hpf — ABNORMAL HIGH (ref 0–5)
pH: 6 (ref 5.0–8.0)

## 2021-08-23 LAB — COMPREHENSIVE METABOLIC PANEL
ALT: 19 U/L (ref 0–44)
AST: 17 U/L (ref 15–41)
Albumin: 4.5 g/dL (ref 3.5–5.0)
Alkaline Phosphatase: 61 U/L (ref 38–126)
Anion gap: 8 (ref 5–15)
BUN: 13 mg/dL (ref 6–20)
CO2: 28 mmol/L (ref 22–32)
Calcium: 9.7 mg/dL (ref 8.9–10.3)
Chloride: 105 mmol/L (ref 98–111)
Creatinine, Ser: 1.28 mg/dL — ABNORMAL HIGH (ref 0.61–1.24)
GFR, Estimated: >60 mL/min (ref >60)
Glucose, Bld: 154 mg/dL — ABNORMAL HIGH (ref 70–99)
Potassium: 4.4 mmol/L (ref 3.5–5.1)
Sodium: 141 mmol/L (ref 135–145)
Total Bilirubin: 1.1 mg/dL (ref 0.3–1.2)
Total Protein: 7.8 g/dL (ref 6.5–8.1)

## 2021-08-23 LAB — CBC WITH DIFFERENTIAL/PLATELET
Abs Immature Granulocytes: 0.07 10*3/uL (ref 0.00–0.07)
Basophils Absolute: 0.1 10*3/uL (ref 0.0–0.1)
Basophils Relative: 0 %
Eosinophils Absolute: 0 10*3/uL (ref 0.0–0.5)
Eosinophils Relative: 0 %
HCT: 46.6 % (ref 39.0–52.0)
Hemoglobin: 15.8 g/dL (ref 13.0–17.0)
Immature Granulocytes: 0 %
Lymphocytes Relative: 6 %
Lymphs Abs: 1.1 10*3/uL (ref 0.7–4.0)
MCH: 29.4 pg (ref 26.0–34.0)
MCHC: 33.9 g/dL (ref 30.0–36.0)
MCV: 86.8 fL (ref 80.0–100.0)
Monocytes Absolute: 0.4 10*3/uL (ref 0.1–1.0)
Monocytes Relative: 2 %
Neutro Abs: 15.3 10*3/uL — ABNORMAL HIGH (ref 1.7–7.7)
Neutrophils Relative %: 92 %
Platelets: 305 10*3/uL (ref 150–400)
RBC: 5.37 MIL/uL (ref 4.22–5.81)
RDW: 12.9 % (ref 11.5–15.5)
WBC: 16.8 10*3/uL — ABNORMAL HIGH (ref 4.0–10.5)
nRBC: 0 % (ref 0.0–0.2)

## 2021-08-23 LAB — CHLAMYDIA/NGC RT PCR (ARMC ONLY)
Chlamydia Tr: NOT DETECTED
N gonorrhoeae: NOT DETECTED

## 2021-08-23 LAB — TROPONIN I (HIGH SENSITIVITY): Troponin I (High Sensitivity): 3 ng/L (ref <18)

## 2021-08-23 LAB — LIPASE, BLOOD: Lipase: 25 U/L (ref 11–51)

## 2021-08-23 MED ORDER — CIPROFLOXACIN HCL 500 MG PO TABS: 500.0000 mg | ORAL_TABLET | Freq: Two times a day (BID) | ORAL | 0 refills | Status: AC

## 2021-08-23 MED ORDER — CEPHALEXIN 500 MG PO CAPS: 500.0000 mg | ORAL_CAPSULE | Freq: Two times a day (BID) | ORAL | 0 refills | Status: DC

## 2021-08-23 MED ORDER — SODIUM CHLORIDE 0.9 % IV SOLN: INTRAVENOUS | Status: DC

## 2021-08-23 MED ORDER — ONDANSETRON 4 MG PO TBDP: 4.0000 mg | ORAL_TABLET | Freq: Four times a day (QID) | ORAL | 0 refills | Status: DC | PRN

## 2021-08-23 MED ORDER — HYDROMORPHONE HCL 1 MG/ML IJ SOLN
1.0000 mg | Freq: Once | INTRAMUSCULAR | Status: AC
  Administered 2021-08-23: 1 mg via INTRAVENOUS
  Filled 2021-08-23: qty 1

## 2021-08-23 MED ORDER — PANTOPRAZOLE SODIUM 40 MG IV SOLR
40.0000 mg | Freq: Once | INTRAVENOUS | Status: AC
Start: 2021-08-23 — End: 2021-08-23
  Administered 2021-08-23: 40 mg via INTRAVENOUS
  Filled 2021-08-23: qty 10

## 2021-08-23 MED ORDER — IOHEXOL 300 MG/ML  SOLN
100.0000 mL | Freq: Once | INTRAMUSCULAR | Status: AC | PRN
  Administered 2021-08-23: 100 mL via INTRAVENOUS

## 2021-08-23 MED ORDER — OXYCODONE-ACETAMINOPHEN 5-325 MG PO TABS: 2.0000 | ORAL_TABLET | Freq: Four times a day (QID) | ORAL | 0 refills | Status: DC | PRN

## 2021-08-23 MED ORDER — SODIUM CHLORIDE 0.9 % IV BOLUS (SEPSIS)
1000.0000 mL | Freq: Once | INTRAVENOUS | Status: AC
  Administered 2021-08-23: 1000 mL via INTRAVENOUS

## 2021-08-23 MED ORDER — CEFTRIAXONE SODIUM 1 G IJ SOLR
1.0000 g | Freq: Once | INTRAMUSCULAR | Status: AC
  Administered 2021-08-23: 1 g via INTRAVENOUS
  Filled 2021-08-23: qty 10

## 2021-08-23 MED ORDER — ONDANSETRON HCL 4 MG/2ML IJ SOLN
4.0000 mg | Freq: Once | INTRAMUSCULAR | Status: AC
  Administered 2021-08-23: 4 mg via INTRAVENOUS
  Filled 2021-08-23: qty 2

## 2021-08-23 NOTE — ED Notes (Signed)
Pt able to tolerate ice water with no complaints.

## 2021-08-23 NOTE — ED Notes (Signed)
Pt given ice water for fluid challenge.  

## 2021-08-23 NOTE — ED Notes (Signed)
Pt updated on need for urine sample. Pt denies any need to go at this time. Will let me know when they are able to do so.  °

## 2021-08-23 NOTE — ED Triage Notes (Signed)
Pt to triage via w/c, appears uncomfortable; c/o rt upper abd pain since 11pm; st pain began after eating pizza; denies hx of same

## 2021-08-23 NOTE — ED Notes (Signed)
ED Provider at bedside. 
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
## 2021-08-23 NOTE — ED Provider Notes (Signed)
Patient notified nurse that he has side effects from cephalexin.  Has caused him loose stools and also itching in the past  He would not like to be on antibiotic that is at all related to penicillin.  Discussed with the patient, discussed risks and benefits and also recommended could trial ciprofloxacin.  Patient agreeable with this plan.  We will utilize ciprofloxacin for 5 days.  We did discuss risks of joint and tendon injury from use of this medication, and if he is to start noticing any unusual joint or muscle type aches with this he will discontinue it and return or notify his doctor  He understands not to fill his cephalexin prescription that was previously given.  Urine will be sent for culture   Sharyn Creamer, MD 08/23/21 [phone]

## 2021-08-23 NOTE — ED Notes (Signed)
Pt to CT

## 2021-08-23 NOTE — Discharge Instructions (Addendum)

## 2021-08-23 NOTE — ED Notes (Signed)
Pt given water to see how he will tolerate oral liquids

## 2021-08-23 NOTE — ED Notes (Signed)
Ultrasound at bedside

## 2021-08-24 LAB — URINE CULTURE: Culture: NO GROWTH

## 2021-08-30 ENCOUNTER — Ambulatory Visit: Payer: Medicaid Other | Admitting: Surgery

## 2021-09-16 ENCOUNTER — Emergency Department
Admission: EM | Admit: 2021-09-16 | Discharge: 2021-09-16 | Disposition: A | Discharge status: Home / Self Care | Payer: Medicaid Other | Attending: Emergency Medicine | Admitting: Emergency Medicine

## 2021-09-16 ENCOUNTER — Other Ambulatory Visit: Payer: Self-pay

## 2021-09-16 DIAGNOSIS — R319 Hematuria, unspecified: Secondary | ICD-10-CM | POA: Diagnosis not present

## 2021-09-16 LAB — CBC WITH DIFFERENTIAL/PLATELET
Abs Immature Granulocytes: 0.03 10*3/uL (ref 0.00–0.07)
Basophils Absolute: 0.1 10*3/uL (ref 0.0–0.1)
Basophils Relative: 1 %
Eosinophils Absolute: 0.2 10*3/uL (ref 0.0–0.5)
Eosinophils Relative: 3 %
HCT: 45.6 % (ref 39.0–52.0)
Hemoglobin: 15.3 g/dL (ref 13.0–17.0)
Immature Granulocytes: 0 %
Lymphocytes Relative: 21 %
Lymphs Abs: 1.9 10*3/uL (ref 0.7–4.0)
MCH: 29.4 pg (ref 26.0–34.0)
MCHC: 33.6 g/dL (ref 30.0–36.0)
MCV: 87.5 fL (ref 80.0–100.0)
Monocytes Absolute: 0.6 10*3/uL (ref 0.1–1.0)
Monocytes Relative: 7 %
Neutro Abs: 5.9 10*3/uL (ref 1.7–7.7)
Neutrophils Relative %: 68 %
Platelets: 251 10*3/uL (ref 150–400)
RBC: 5.21 MIL/uL (ref 4.22–5.81)
RDW: 13.2 % (ref 11.5–15.5)
WBC: 8.7 10*3/uL (ref 4.0–10.5)
nRBC: 0 % (ref 0.0–0.2)

## 2021-09-16 LAB — CHLAMYDIA/NGC RT PCR (ARMC ONLY)
Chlamydia Tr: NOT DETECTED
N gonorrhoeae: NOT DETECTED

## 2021-09-16 LAB — BASIC METABOLIC PANEL
Anion gap: 6 (ref 5–15)
BUN: 16 mg/dL (ref 6–20)
CO2: 30 mmol/L (ref 22–32)
Calcium: 9.1 mg/dL (ref 8.9–10.3)
Chloride: 102 mmol/L (ref 98–111)
Creatinine, Ser: 0.95 mg/dL (ref 0.61–1.24)
GFR, Estimated: >60 mL/min (ref >60)
Glucose, Bld: 103 mg/dL — ABNORMAL HIGH (ref 70–99)
Potassium: 3.9 mmol/L (ref 3.5–5.1)
Sodium: 138 mmol/L (ref 135–145)

## 2021-09-16 LAB — URINALYSIS, ROUTINE W REFLEX MICROSCOPIC
Bacteria, UA: NONE SEEN
Bilirubin Urine: NEGATIVE
Glucose, UA: NEGATIVE mg/dL
Ketones, ur: NEGATIVE mg/dL
Nitrite: NEGATIVE
Protein, ur: 30 mg/dL — AB
RBC / HPF: >50 RBC/hpf — ABNORMAL HIGH (ref 0–5)
Specific Gravity, Urine: 1.023 (ref 1.005–1.030)
pH: 5 (ref 5.0–8.0)

## 2021-09-16 LAB — HEPATIC FUNCTION PANEL
ALT: 29 U/L (ref 0–44)
AST: 18 U/L (ref 15–41)
Albumin: 4 g/dL (ref 3.5–5.0)
Alkaline Phosphatase: 67 U/L (ref 38–126)
Bilirubin, Direct: 0.1 mg/dL (ref 0.0–0.2)
Indirect Bilirubin: 0.8 mg/dL (ref 0.3–0.9)
Total Bilirubin: 0.9 mg/dL (ref 0.3–1.2)
Total Protein: 7.3 g/dL (ref 6.5–8.1)

## 2021-09-16 LAB — LIPASE, BLOOD: Lipase: 35 U/L (ref 11–51)

## 2021-09-16 NOTE — Discharge Instructions (Addendum)
Your urine sample showed blood but no evidence of infection.  Your blood work was all reassuring.  It is possible that you are passing a kidney stone.  Please follow-up with urology.  If you develop pain or fevers please return to the emergency department.
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
## 2021-09-16 NOTE — ED Triage Notes (Signed)
Pt states that he started noticing blood in his urine today- pt states he has burning when he urinates- pt denies flank pain, but does have some lower abd pain- pt does have a hx of kidney stones- pt denies any abnormal discharge

## 2021-09-16 NOTE — ED Provider Triage Note (Signed)
Emergency Medicine Provider Triage Evaluation Note  NYSHAUN Mcmillan , a 36 y.o. male  was evaluated in triage.  Pt complains of hematuria that began today. Burning with urination "like something was cutting me." No flank pain though reports mild right lower abdominal pain. History of kidney stones s/p lithotripsy. +nausea, no vomiting. No penile discharge. No fever/chills. Reports pain is 0 currently. Denies injuries/trauma.  Review of Systems  Positive: Hematuria and dysuria Negative: Vomiting, discharge  Physical Exam  There were no vitals taken for this visit. Gen:   Awake, no distress   Resp:  Normal effort  MSK:   Moves extremities without difficulty  Other:    Medical Decision Making  Medically screening exam initiated at 12:24 PM.  Appropriate orders placed.  YOTAM RHINE was informed that the remainder of the evaluation will be completed by another provider, this initial triage assessment does not replace that evaluation, and the importance of remaining in the ED until their evaluation is complete.     Jackelyn Hoehn, PA-C 09/16/21 1229

## 2021-09-16 NOTE — ED Notes (Signed)
Dc ppw provided to patient. Pt questions and followup reviewed. PT declines vs at time of dc and provides verbal consent for DC. Pt alert and oriented on foot to lobby. 

## 2021-10-20 ENCOUNTER — Other Ambulatory Visit: Payer: Self-pay

## 2021-10-20 ENCOUNTER — Encounter
Admission: EM | Disposition: A | Discharge status: Home / Self Care | Payer: Self-pay | Source: Home / Self Care | Attending: Emergency Medicine

## 2021-10-20 ENCOUNTER — Emergency Department: Payer: Medicaid Other

## 2021-10-20 ENCOUNTER — Emergency Department: Payer: Medicaid Other | Admitting: Anesthesiology

## 2021-10-20 ENCOUNTER — Ambulatory Visit
Admission: EM | Admit: 2021-10-20 | Discharge: 2021-10-20 | Disposition: A | Discharge status: Home / Self Care | Payer: Medicaid Other | Attending: Emergency Medicine | Admitting: Emergency Medicine

## 2021-10-20 DIAGNOSIS — Z79899 Other long term (current) drug therapy: Secondary | ICD-10-CM | POA: Insufficient documentation

## 2021-10-20 DIAGNOSIS — K8000 Calculus of gallbladder with acute cholecystitis without obstruction: Secondary | ICD-10-CM

## 2021-10-20 DIAGNOSIS — K8050 Calculus of bile duct without cholangitis or cholecystitis without obstruction: Secondary | ICD-10-CM | POA: Diagnosis present

## 2021-10-20 DIAGNOSIS — K8010 Calculus of gallbladder with chronic cholecystitis without obstruction: Secondary | ICD-10-CM | POA: Insufficient documentation

## 2021-10-20 DIAGNOSIS — F419 Anxiety disorder, unspecified: Secondary | ICD-10-CM | POA: Insufficient documentation

## 2021-10-20 DIAGNOSIS — F32A Depression, unspecified: Secondary | ICD-10-CM | POA: Diagnosis not present

## 2021-10-20 LAB — CBC WITH DIFFERENTIAL/PLATELET
Abs Immature Granulocytes: 0.03 10*3/uL (ref 0.00–0.07)
Basophils Absolute: 0.1 10*3/uL (ref 0.0–0.1)
Basophils Relative: 1 %
Eosinophils Absolute: 0.4 10*3/uL (ref 0.0–0.5)
Eosinophils Relative: 4 %
HCT: 44.3 % (ref 39.0–52.0)
Hemoglobin: 15.1 g/dL (ref 13.0–17.0)
Immature Granulocytes: 0 %
Lymphocytes Relative: 22 %
Lymphs Abs: 2.1 10*3/uL (ref 0.7–4.0)
MCH: 29.9 pg (ref 26.0–34.0)
MCHC: 34.1 g/dL (ref 30.0–36.0)
MCV: 87.7 fL (ref 80.0–100.0)
Monocytes Absolute: 0.7 10*3/uL (ref 0.1–1.0)
Monocytes Relative: 7 %
Neutro Abs: 6.3 10*3/uL (ref 1.7–7.7)
Neutrophils Relative %: 66 %
Platelets: 283 10*3/uL (ref 150–400)
RBC: 5.05 MIL/uL (ref 4.22–5.81)
RDW: 12.8 % (ref 11.5–15.5)
WBC: 9.6 10*3/uL (ref 4.0–10.5)
nRBC: 0 % (ref 0.0–0.2)

## 2021-10-20 LAB — COMPREHENSIVE METABOLIC PANEL
ALT: 15 U/L (ref 0–44)
AST: 13 U/L — ABNORMAL LOW (ref 15–41)
Albumin: 4.3 g/dL (ref 3.5–5.0)
Alkaline Phosphatase: 61 U/L (ref 38–126)
Anion gap: 5 (ref 5–15)
BUN: 17 mg/dL (ref 6–20)
CO2: 28 mmol/L (ref 22–32)
Calcium: 9.3 mg/dL (ref 8.9–10.3)
Chloride: 108 mmol/L (ref 98–111)
Creatinine, Ser: 1.3 mg/dL — ABNORMAL HIGH (ref 0.61–1.24)
GFR, Estimated: >60 mL/min (ref >60)
Glucose, Bld: 150 mg/dL — ABNORMAL HIGH (ref 70–99)
Potassium: 4.1 mmol/L (ref 3.5–5.1)
Sodium: 141 mmol/L (ref 135–145)
Total Bilirubin: 0.3 mg/dL (ref 0.3–1.2)
Total Protein: 7.3 g/dL (ref 6.5–8.1)

## 2021-10-20 LAB — LIPASE, BLOOD: Lipase: 37 U/L (ref 11–51)

## 2021-10-20 LAB — PROTIME-INR
INR: 1 (ref 0.8–1.2)
Prothrombin Time: 13 seconds (ref 11.4–15.2)

## 2021-10-20 LAB — APTT: aPTT: 27 seconds (ref 24–36)

## 2021-10-20 SURGERY — CHOLECYSTECTOMY, ROBOT-ASSISTED, LAPAROSCOPIC
Anesthesia: General | Site: Abdomen

## 2021-10-20 MED ORDER — PIPERACILLIN-TAZOBACTAM 3.375 G IVPB
INTRAVENOUS | Status: AC
  Filled 2021-10-20: qty 50

## 2021-10-20 MED ORDER — ACETAMINOPHEN 10 MG/ML IV SOLN
INTRAVENOUS | Status: AC
  Filled 2021-10-20: qty 100

## 2021-10-20 MED ORDER — PROPOFOL 10 MG/ML IV BOLUS
INTRAVENOUS | Status: DC | PRN
  Administered 2021-10-20: 200 mg via INTRAVENOUS

## 2021-10-20 MED ORDER — SUCCINYLCHOLINE CHLORIDE 200 MG/10ML IV SOSY
PREFILLED_SYRINGE | INTRAVENOUS | Status: AC
  Filled 2021-10-20: qty 10

## 2021-10-20 MED ORDER — BUPIVACAINE LIPOSOME 1.3 % IJ SUSP
INTRAMUSCULAR | Status: DC | PRN
  Administered 2021-10-20: 20 mL

## 2021-10-20 MED ORDER — KETOROLAC TROMETHAMINE 30 MG/ML IJ SOLN
INTRAMUSCULAR | Status: AC
  Filled 2021-10-20: qty 1

## 2021-10-20 MED ORDER — KETAMINE HCL 10 MG/ML IJ SOLN
INTRAMUSCULAR | Status: DC | PRN
  Administered 2021-10-20: 30 mg via INTRAVENOUS

## 2021-10-20 MED ORDER — KETAMINE HCL 50 MG/5ML IJ SOSY
PREFILLED_SYRINGE | INTRAMUSCULAR | Status: AC
  Filled 2021-10-20: qty 5

## 2021-10-20 MED ORDER — OXYCODONE HCL 5 MG PO TABS
ORAL_TABLET | ORAL | Status: AC
  Filled 2021-10-20: qty 1

## 2021-10-20 MED ORDER — INDOCYANINE GREEN 25 MG IV SOLR
2.5000 mg | Freq: Once | INTRAVENOUS | Status: AC
  Administered 2021-10-20: 2.5 mg via INTRAVENOUS
  Filled 2021-10-20: qty 1

## 2021-10-20 MED ORDER — ROCURONIUM BROMIDE 10 MG/ML (PF) SYRINGE
PREFILLED_SYRINGE | INTRAVENOUS | Status: AC
  Filled 2021-10-20: qty 10

## 2021-10-20 MED ORDER — ACETAMINOPHEN 10 MG/ML IV SOLN: 1000.0000 mg | Freq: Once | INTRAVENOUS | Status: DC | PRN

## 2021-10-20 MED ORDER — FENTANYL CITRATE PF 50 MCG/ML IJ SOSY
50.0000 ug | PREFILLED_SYRINGE | INTRAMUSCULAR | Status: DC | PRN
  Administered 2021-10-20: 50 ug via INTRAVENOUS
  Filled 2021-10-20: qty 1

## 2021-10-20 MED ORDER — ONDANSETRON HCL 4 MG/2ML IJ SOLN: 4.0000 mg | Freq: Once | INTRAMUSCULAR | Status: DC | PRN

## 2021-10-20 MED ORDER — FENTANYL CITRATE (PF) 100 MCG/2ML IJ SOLN
INTRAMUSCULAR | Status: DC | PRN
  Administered 2021-10-20: 100 ug via INTRAVENOUS
  Administered 2021-10-20: 50 ug via INTRAVENOUS

## 2021-10-20 MED ORDER — MIDAZOLAM HCL 2 MG/2ML IJ SOLN
INTRAMUSCULAR | Status: AC
  Filled 2021-10-20: qty 2

## 2021-10-20 MED ORDER — LIDOCAINE HCL (PF) 2 % IJ SOLN
INTRAMUSCULAR | Status: AC
  Filled 2021-10-20: qty 5

## 2021-10-20 MED ORDER — MIDAZOLAM HCL 2 MG/2ML IJ SOLN
INTRAMUSCULAR | Status: DC | PRN
  Administered 2021-10-20: 2 mg via INTRAVENOUS

## 2021-10-20 MED ORDER — HYDROCODONE-ACETAMINOPHEN 5-325 MG PO TABS: 1.0000 | ORAL_TABLET | ORAL | 0 refills | Status: DC | PRN

## 2021-10-20 MED ORDER — OXYCODONE HCL 5 MG PO TABS: 5.0000 mg | ORAL_TABLET | Freq: Once | ORAL | Status: AC | PRN

## 2021-10-20 MED ORDER — DEXAMETHASONE SODIUM PHOSPHATE 10 MG/ML IJ SOLN
INTRAMUSCULAR | Status: DC | PRN
  Administered 2021-10-20: 10 mg via INTRAVENOUS

## 2021-10-20 MED ORDER — 0.9 % SODIUM CHLORIDE (POUR BTL) OPTIME
TOPICAL | Status: DC | PRN
  Administered 2021-10-20: 500 mL

## 2021-10-20 MED ORDER — PROPOFOL 10 MG/ML IV BOLUS
INTRAVENOUS | Status: AC
  Filled 2021-10-20: qty 40

## 2021-10-20 MED ORDER — IPRATROPIUM-ALBUTEROL 0.5-2.5 (3) MG/3ML IN SOLN: 3.0000 mL | Freq: Once | RESPIRATORY_TRACT | Status: AC

## 2021-10-20 MED ORDER — IPRATROPIUM-ALBUTEROL 0.5-2.5 (3) MG/3ML IN SOLN
RESPIRATORY_TRACT | Status: AC
  Administered 2021-10-20: 3 mL
  Filled 2021-10-20: qty 3

## 2021-10-20 MED ORDER — LIDOCAINE HCL (CARDIAC) PF 100 MG/5ML IV SOSY
PREFILLED_SYRINGE | INTRAVENOUS | Status: DC | PRN
  Administered 2021-10-20: 100 mg via INTRAVENOUS

## 2021-10-20 MED ORDER — ACETAMINOPHEN 10 MG/ML IV SOLN
INTRAVENOUS | Status: DC | PRN
  Administered 2021-10-20: 1000 mg via INTRAVENOUS

## 2021-10-20 MED ORDER — FENTANYL CITRATE (PF) 100 MCG/2ML IJ SOLN
INTRAMUSCULAR | Status: AC
  Filled 2021-10-20: qty 2

## 2021-10-20 MED ORDER — FENTANYL CITRATE (PF) 100 MCG/2ML IJ SOLN
INTRAMUSCULAR | Status: AC
  Administered 2021-10-20: 50 ug via INTRAVENOUS
  Filled 2021-10-20: qty 2

## 2021-10-20 MED ORDER — BUPIVACAINE-EPINEPHRINE (PF) 0.25% -1:200000 IJ SOLN
INTRAMUSCULAR | Status: DC | PRN
  Administered 2021-10-20: 30 mL via PERINEURAL

## 2021-10-20 MED ORDER — ONDANSETRON HCL 4 MG/2ML IJ SOLN
4.0000 mg | Freq: Once | INTRAMUSCULAR | Status: AC
  Administered 2021-10-20: 4 mg via INTRAVENOUS
  Filled 2021-10-20: qty 2

## 2021-10-20 MED ORDER — DEXAMETHASONE SODIUM PHOSPHATE 10 MG/ML IJ SOLN
INTRAMUSCULAR | Status: AC
  Filled 2021-10-20: qty 1

## 2021-10-20 MED ORDER — SUCCINYLCHOLINE CHLORIDE 200 MG/10ML IV SOSY
PREFILLED_SYRINGE | INTRAVENOUS | Status: DC | PRN
  Administered 2021-10-20: 140 mg via INTRAVENOUS

## 2021-10-20 MED ORDER — CHLORHEXIDINE GLUCONATE 0.12 % MT SOLN
OROMUCOSAL | Status: AC
  Administered 2021-10-20: 15 mL
  Filled 2021-10-20: qty 15

## 2021-10-20 MED ORDER — ONDANSETRON HCL 4 MG/2ML IJ SOLN
INTRAMUSCULAR | Status: AC
  Filled 2021-10-20: qty 2

## 2021-10-20 MED ORDER — SUGAMMADEX SODIUM 200 MG/2ML IV SOLN
INTRAVENOUS | Status: DC | PRN
  Administered 2021-10-20: 200 mg via INTRAVENOUS

## 2021-10-20 MED ORDER — ONDANSETRON HCL 4 MG/2ML IJ SOLN
INTRAMUSCULAR | Status: DC | PRN
  Administered 2021-10-20: 4 mg via INTRAVENOUS

## 2021-10-20 MED ORDER — OXYCODONE HCL 5 MG PO TABS
ORAL_TABLET | ORAL | Status: AC
  Administered 2021-10-20: 5 mg via ORAL
  Filled 2021-10-20: qty 1

## 2021-10-20 MED ORDER — SODIUM CHLORIDE 0.9 % IV SOLN
INTRAVENOUS | Status: AC
  Filled 2021-10-20: qty 2

## 2021-10-20 MED ORDER — SODIUM CHLORIDE 0.9 % IV SOLN
2.0000 g | Freq: Two times a day (BID) | INTRAVENOUS | Status: DC
  Administered 2021-10-20: 2 g via INTRAVENOUS
  Filled 2021-10-20: qty 2

## 2021-10-20 MED ORDER — OXYCODONE HCL 5 MG/5ML PO SOLN: 5.0000 mg | Freq: Once | ORAL | Status: AC | PRN

## 2021-10-20 MED ORDER — FENTANYL CITRATE (PF) 100 MCG/2ML IJ SOLN
25.0000 ug | INTRAMUSCULAR | Status: DC | PRN
  Administered 2021-10-20: 50 ug via INTRAVENOUS

## 2021-10-20 MED ORDER — OXYCODONE HCL 5 MG PO TABS
5.0000 mg | ORAL_TABLET | Freq: Once | ORAL | Status: AC
  Administered 2021-10-20: 5 mg via ORAL

## 2021-10-20 MED ORDER — LACTATED RINGERS IV SOLN: INTRAVENOUS | Status: DC | PRN

## 2021-10-20 MED ORDER — CHLORHEXIDINE GLUCONATE 0.12 % MT SOLN: 15.0000 mL | Freq: Once | OROMUCOSAL | Status: AC

## 2021-10-20 MED ORDER — ROCURONIUM BROMIDE 100 MG/10ML IV SOLN
INTRAVENOUS | Status: DC | PRN
  Administered 2021-10-20: 60 mg via INTRAVENOUS

## 2021-10-20 SURGICAL SUPPLY — 49 items
CANNULA REDUC XI 12-8 STAPL (CANNULA) ×1
CANNULA REDUCER 12-8 DVNC XI (CANNULA) ×1 IMPLANT
CATH REDDICK CHOLANGI 4FR 50CM (CATHETERS) IMPLANT
CLIP LIGATING HEMO O LOK GREEN (MISCELLANEOUS) ×2 IMPLANT
DERMABOND ADVANCED (GAUZE/BANDAGES/DRESSINGS) ×1
DERMABOND ADVANCED .7 DNX12 (GAUZE/BANDAGES/DRESSINGS) ×1 IMPLANT
DRAPE ARM DVNC X/XI (DISPOSABLE) ×4 IMPLANT
DRAPE COLUMN DVNC XI (DISPOSABLE) ×1 IMPLANT
DRAPE DA VINCI XI ARM (DISPOSABLE) ×4
DRAPE DA VINCI XI COLUMN (DISPOSABLE) ×1
ELECT CAUTERY BLADE 6.4 (BLADE) ×2 IMPLANT
ELECT REM PT RETURN 9FT ADLT (ELECTROSURGICAL) ×2
ELECTRODE REM PT RTRN 9FT ADLT (ELECTROSURGICAL) ×1 IMPLANT
GLOVE BIO SURGEON STRL SZ7 (GLOVE) ×4 IMPLANT
GOWN STRL REUS W/ TWL LRG LVL3 (GOWN DISPOSABLE) ×4 IMPLANT
GOWN STRL REUS W/TWL LRG LVL3 (GOWN DISPOSABLE) ×3
IRRIGATION STRYKERFLOW (MISCELLANEOUS) IMPLANT
IRRIGATOR STRYKERFLOW (MISCELLANEOUS)
IV CATH ANGIO 12GX3 LT BLUE (NEEDLE) IMPLANT
KIT PINK PAD W/HEAD ARE REST (MISCELLANEOUS) ×2 IMPLANT
KIT PINK PAD W/HEAD ARM REST (MISCELLANEOUS) ×1 IMPLANT
LABEL OR SOLS (LABEL) ×2 IMPLANT
MANIFOLD NEPTUNE II (INSTRUMENTS) ×2 IMPLANT
NEEDLE HYPO 22GX1.5 SAFETY (NEEDLE) ×2 IMPLANT
NS IRRIG 500ML POUR BTL (IV SOLUTION) ×2 IMPLANT
OBTURATOR OPTICAL STANDARD 8MM (TROCAR) ×1
OBTURATOR OPTICAL STND 8 DVNC (TROCAR) ×1
OBTURATOR OPTICALSTD 8 DVNC (TROCAR) ×1 IMPLANT
PACK LAP CHOLECYSTECTOMY (MISCELLANEOUS) ×2 IMPLANT
PENCIL ELECTRO HAND CTR (MISCELLANEOUS) ×1 IMPLANT
SEAL CANN UNIV 5-8 DVNC XI (MISCELLANEOUS) ×3 IMPLANT
SEAL XI 5MM-8MM UNIVERSAL (MISCELLANEOUS) ×3
SET TUBE SMOKE EVAC HIGH FLOW (TUBING) ×2 IMPLANT
SOLUTION ELECTROLUBE (MISCELLANEOUS) ×2 IMPLANT
SPIKE FLUID TRANSFER (MISCELLANEOUS) ×2 IMPLANT
SPONGE T-LAP 18X18 ~~LOC~~+RFID (SPONGE) ×2 IMPLANT
SPONGE T-LAP 4X18 ~~LOC~~+RFID (SPONGE) IMPLANT
STAPLER CANNULA SEAL DVNC XI (STAPLE) ×1 IMPLANT
STAPLER CANNULA SEAL XI (STAPLE) ×1
STOPCOCK 3 WAY MALE LL (IV SETS)
STOPCOCK 3WAY MALE LL (IV SETS) IMPLANT
SUT MNCRL AB 4-0 PS2 18 (SUTURE) ×2 IMPLANT
SUT VICRYL 0 AB UR-6 (SUTURE) ×4 IMPLANT
SYR 20ML LL LF (SYRINGE) ×2 IMPLANT
SYR 30ML LL (SYRINGE) ×2 IMPLANT
SYS BAG RETRIEVAL 10MM (BASKET) ×2
SYSTEM BAG RETRIEVAL 10MM (BASKET) ×1 IMPLANT
WATER STERILE IRR 3000ML UROMA (IV SOLUTION) IMPLANT
WATER STERILE IRR 500ML POUR (IV SOLUTION) ×2 IMPLANT

## 2021-10-20 NOTE — Discharge Instructions (Addendum)
Laparoscopic Cholecystectomy, Care After  ° °These instructions give you information on caring for yourself after your procedure. Your doctor may also give you more specific instructions. Call your doctor if you have any problems or questions after your procedure.  °HOME CARE  °Change your bandages (dressings) as told by your doctor.  °Keep the wound dry and clean. Wash the wound gently with soap and water. Pat the wound dry with a clean towel.  °Do not take baths, swim, or use hot tubs for 2 weeks, or as told by your doctor.  °Only take medicine as told by your doctor.  °Eat a normal diet as told by your doctor.  °Do not lift anything heavier than 10 pounds (4.5 kg) until your doctor says it is okay.  °Do not play contact sports for 1 week, or as told by your doctor. °GET HELP IF:  °Your wound is red, puffy (swollen), or painful.  °You have yellowish-white fluid (pus) coming from the wound.  °You have fluid draining from the wound for more than 1 day.  °You have a bad smell coming from the wound.  °Your wound breaks open. °GET HELP RIGHT AWAY IF:  °You have trouble breathing.  °You have chest pain.  °You have a fever >101  °You have pain in the shoulders (shoulder strap areas) that is getting worse.  °You feel dizzy or pass out (faint).  °You have severe belly (abdominal) pain.  °You feel sick to your stomach (nauseous) or throw up (vomit) for more than 1 day. ° °AMBULATORY SURGERY  °DISCHARGE INSTRUCTIONS ° ° °The drugs that you were given will stay in your system until tomorrow so for the next 24 hours you should not: ° °Drive an automobile °Make any legal decisions °Drink any alcoholic beverage ° ° °You may resume regular meals tomorrow.  Today it is better to start with liquids and gradually work up to solid foods. ° °You may eat anything you prefer, but it is better to start with liquids, then soup and crackers, and gradually work up to solid foods. ° ° °Please notify your doctor immediately if you have any  unusual bleeding, trouble breathing, redness and pain at the surgery site, drainage, fever, or pain not relieved by medication. ° ° ° °Additional Instructions: °Please contact your physician with any problems or Same Day Surgery at [phone], Monday through Friday 6 am to 4 pm, or [facility] [place] Main number at [phone].  °  °

## 2021-10-20 NOTE — Transfer of Care (Signed)
Immediate Anesthesia Transfer of Care Note  Patient: Kyle Mcmillan  Procedure(s) Performed: XI ROBOTIC ASSISTED LAPAROSCOPIC CHOLECYSTECTOMY (Abdomen) INDOCYANINE GREEN FLUORESCENCE IMAGING (ICG) (Abdomen)  Patient Location: PACU  Anesthesia Type:General  Level of Consciousness: awake, alert  and oriented  Airway & Oxygen Therapy: Patient Spontanous Breathing and Patient connected to nasal cannula oxygen  Post-op Assessment: Report given to RN and Post -op Vital signs reviewed and stable  Post vital signs: Reviewed and stable  Last Vitals:  Vitals Value Taken Time  BP 138/75 10/20/21 1332  Temp 36.6 C 10/20/21 1332  Pulse 90 10/20/21 1334  Resp 21 10/20/21 1334  SpO2 100 % 10/20/21 1334  Vitals shown include unvalidated device data.  Last Pain:  Vitals:   10/20/21 1332  TempSrc: Temporal  PainSc:          Complications: No notable events documented.

## 2021-10-20 NOTE — H&P (Signed)
Patient ID: Kyle Mcmillan, male   DOB: [birth year], 36 y.o.   MRN: [id number]  HPI Kyle Mcmillan is a 36 y.o. male seen in consultation at the request of Dr. Darnelle Catalan.  He reported having an acute episode of right upper quadrant  pain at 3 AM.  This woke him from his sleep.  He had multiple episodes of vomiting.  He did have a similar episode in May earlier this year.  He denies any fevers any chills.  His CMP and his CBC was completely normal except creat.  He does have slight increase in creatinine from an acute kidney injury secondary to dehydration.  INR is normal.  Ultrasound personally reviewed there is evidence of cholelithiasis.  Normal common bile duct. Increase in thickness of gallbladder wall consistent with early acute cholecystitis. He works in the Pharmac[employer], hospital and he is able to perform more than 4 METS of activity without any shortness of breath or chest pain.  He was told that he had a heart murmur as a child ,this has been asymptomatic from a cardiac perspective Takes buspirone and Remeron.  He is competent.  Prior history of depression and anxiety  HPI  Past Medical History:  Diagnosis Date   Anxiety    Bronchitis     Past Surgical History:  Procedure Laterality Date   CYSTOSCOPY W/ URETEROSCOPY W/ LITHOTRIPSY      Family History  Family history unknown: Yes    Social History Social History   Tobacco Use   Smoking status: Never   Smokeless tobacco: Never  Vaping Use   Vaping Use: Never used  Substance Use Topics   Alcohol use: No   Drug use: Never    Allergies  Allergen Reactions   Ibuprofen Swelling   Penicillins    Phenobarbital    Tetanus Toxoids     Current Facility-Administered Medications  Medication Dose Route Frequency Provider Last Rate Last Admin   fentaNYL (SUBLIMAZE) injection 50 mcg  50 mcg Intravenous Q30 min PRN Arnaldo Natal, MD   50 mcg at 10/20/21 0700   indocyanine green (IC-GREEN) injection 2.5 mg  2.5 mg Intravenous Once  Kenna Kirn F, MD       Current Outpatient Medications  Medication Sig Dispense Refill   busPIRone (BUSPAR) 5 MG tablet Take 1 tablet (5 mg total) by mouth 3 (three) times daily. 90 tablet 1   chlorhexidine (PERIDEX) 0.12 % solution Use as directed 15 mLs in the mouth or throat 2 (two) times daily. 120 mL 0   fluticasone (FLONASE) 50 MCG/ACT nasal spray Place 1 spray into both nostrils daily for 7 days. 9.9 mL 0   mirtazapine (REMERON) 15 MG tablet Take 1 tablet (15 mg total) by mouth at bedtime. 30 tablet 1   ondansetron (ZOFRAN-ODT) 4 MG disintegrating tablet Take 1 tablet (4 mg total) by mouth every 6 (six) hours as needed for nausea or vomiting. 20 tablet 0   oxyCODONE-acetaminophen (PERCOCET) 5-325 MG tablet Take 2 tablets by mouth every 6 (six) hours as needed for severe pain. 16 tablet 0   pseudoephedrine (SUDAFED) 60 MG tablet Take 1 tablet (60 mg total) by mouth every 6 (six) hours as needed for congestion (sinus/ear pressure). 30 tablet 0     Review of Systems Full ROS  was asked and was negative except for the information on the HPI  Physical Exam Blood pressure 131/85, pulse 77, temperature 97.6 F (36.4 C), temperature source Oral, resp. rate 19, height 5'  11" (1.803 m), weight 108.9 kg, SpO2 98 %. CONSTITUTIONAL: NAD. EYES: Pupils are equal, round,  Sclera are non-icteric. EARS, NOSE, MOUTH AND THROAT:  The oral mucosa is pink and moist. Hearing is intact to voice. LYMPH NODES:  Lymph nodes in the neck are normal. RESPIRATORY:  Lungs are clear. There is normal respiratory effort, with equal breath sounds bilaterally, and without pathologic use of accessory muscles. CARDIOVASCULAR: Heart is regular without murmurs, gallops, or rubs. GI: The abdomen is  soft, tender right upper quadrant with positive Murphy sign.  No peritonitis there are no palpable masses. There is no hepatosplenomegaly. There are normal bowel sounds  GU: Rectal deferred.   MUSCULOSKELETAL: Normal muscle  strength and tone. No cyanosis or edema.   SKIN: Turgor is good and there are no pathologic skin lesions or ulcers. NEUROLOGIC: Motor and sensation is grossly normal. Cranial nerves are grossly intact. PSYCH:  Oriented to person, place and time. Affect is normal.  Data Reviewed  I have personally reviewed the patient's imaging, laboratory findings and medical records.    Assessment/Plan 36 year old male with acute cholecystitis and cholelithiasis.  Discussed with the patient in detail about his disease process.  This is at least his second episode.  I do recommend admission and from cholecystectomy as this is the best treatment for acute cholecystitis.  Initially he had some second thoughts about potential surgery.  He was afraid about not waking up after being put to sleep.  He also wanted to do karaoke with his dad tonight. I discussed the procedure in detail.  The patient was given Agricultural engineer.  We discussed the risks and benefits of a laparoscopic cholecystectomy and possible cholangiogram including, but not limited to bleeding, infection, injury to surrounding structures such as the intestine or liver, bile leak, retained gallstones, need to convert to an open procedure, prolonged diarrhea, blood clots such as  DVT, common bile duct injury, anesthesia risks, and possible need for additional procedures.  The likelihood of improvement in symptoms and return to the patient's normal status is good. We discussed the typical post-operative recovery course.  Please Note  that I spent greater than 75 minutes in this encounter including coordination of his care, counseling the patient, placing orders and performing appropriate documentation  Sterling Big, MD FACS General Surgeon 10/20/2021, 10:38 AM

## 2021-10-20 NOTE — Op Note (Signed)
Robotic assisted laparoscopic Cholecystectomy  Pre-operative Diagnosis: Acute cholecystitis  Post-operative Diagnosis: same  Procedure:  Robotic assisted laparoscopic Cholecystectomy  Surgeon: Sterling Big, MD FACS  Anesthesia: Gen. with endotracheal tube  Findings: Acute severe  Cholecystitis   Estimated Blood Loss: 5cc       Specimens: Gallbladder           Complications: none   Procedure Details  The patient was seen again in the Holding Room. The benefits, complications, treatment options, and expected outcomes were discussed with the patient. The risks of bleeding, infection, recurrence of symptoms, failure to resolve symptoms, bile duct damage, bile duct leak, retained common bile duct stone, bowel injury, any of which could require further surgery and/or ERCP, stent, or papillotomy were reviewed with the patient. The likelihood of improving the patient's symptoms with return to their baseline status is good.  The patient and/or family concurred with the proposed plan, giving informed consent.  The patient was taken to Operating Room, identified  and the procedure verified as Laparoscopic Cholecystectomy.  A Time Out was held and the above information confirmed.  Prior to the induction of general anesthesia, antibiotic prophylaxis was administered. VTE prophylaxis was in place. General endotracheal anesthesia was then administered and tolerated well. After the induction, the abdomen was prepped with Chloraprep and draped in the sterile fashion. The patient was positioned in the supine position.  Cut down technique was used to enter the abdominal cavity and a Hasson trochar was placed after two vicryl stitches were anchored to the fascia. Pneumoperitoneum was then created with CO2 and tolerated well without any adverse changes in the patient's vital signs.  Three 8-mm ports were placed under direct vision. All skin incisions  were infiltrated with a local anesthetic agent before  making the incision and placing the trocars.   The patient was positioned  in reverse Trendelenburg, robot was brought to the surgical field and docked in the standard fashion.  We made sure all the instrumentation was kept indirect view at all times and that there were no collision between the arms. I scrubbed out and went to the console.  The gallbladder was identified, the fundus grasped and retracted cephalad. Adhesions were lysed bluntly. The infundibulum was grasped and retracted laterally, exposing the peritoneum overlying the triangle of Calot. This was then divided and exposed in a blunt fashion. An extended critical view of the cystic duct and cystic artery was obtained.  The cystic duct was clearly identified and bluntly dissected.   Artery and duct were double clipped and divided. Using ICG cholangiography we visualize the cystic duct and CBD no evidence of bile injuries observed. The gallbladder was taken from the gallbladder fossa in a retrograde fashion with the electrocautery.  Hemostasis was achieved with the electrocautery. nspection of the right upper quadrant was performed. No bleeding, bile duct injury or leak, or bowel injury was noted. Robotic instruments and robotic arms were undocked in the standard fashion.  I scrubbed back in.  The gallbladder was removed and placed in an Endocatch bag.   Pneumoperitoneum was released.  The periumbilical port site was closed with interrumpted 0 Vicryl sutures. 4-0 subcuticular Monocryl was used to close the skin. Dermabond was  applied.  The patient was then extubated and brought to the recovery room in stable condition. Sponge, lap, and needle counts were correct at closure and at the conclusion of the case.               Kyle Mcmillan [name],  MD, FACS

## 2021-10-20 NOTE — ED Triage Notes (Signed)
Pt presents to ER with c/o RUQ pain that started around 0300 today.  Pt states he has hx of gall stones back in May 2023.  Pt actively vomiting in triage, stating pain is unbearable.  Pt A&O x4 at this time.

## 2021-10-20 NOTE — ED Provider Notes (Addendum)
[facility] Provider Note    Event Date/Time   First MD Initiated Contact with Patient 10/20/21 [phone]     (approximate)   History   Abdominal Pain and Emesis   HPI  Kyle Mcmillan is a 36 y.o. male   right upper quadrant pain at about 3:00 this morning.  He had an attack of biliary colic in 2023 in May.  He was actively vomiting in triage with severe pain he got some fentanyl pain is now much much better.  He is interested in having his gallbladder out if possible.      Physical Exam   Triage Vital Signs: ED Triage Vitals  Enc Vitals Group     BP 10/20/21 0650 (!) 146/94     Pulse Rate 10/20/21 0650 71     Resp 10/20/21 0650 (!) 22     Temp --      Temp src --      SpO2 10/20/21 0650 100 %     Weight 10/20/21 0651 240 lb (108.9 kg)     Height 10/20/21 0651 5\' 11"  (1.803 m)     Head Circumference --      Peak Flow --      Pain Score 10/20/21 0651 10     Pain Loc --      Pain Edu? --      Excl. in GC? --     Most recent vital signs: Vitals:   10/20/21 0650  BP: (!) 146/94  Pulse: 71  Resp: (!) 22  SpO2: 100%     General: Awake, no distress.  CV:  Good peripheral perfusion.  Heart regular rate and rhythm no audible murmurs Resp:  Normal effort.  Lungs are clear Abd:  No distention.  Soft there is some tenderness in the right upper quadrant in the area of the gallbladder    ED Results / Procedures / Treatments   Labs (all labs ordered are listed, but only abnormal results are displayed) Labs Reviewed  COMPREHENSIVE METABOLIC PANEL - Abnormal; Notable for the following components:      Result Value   Glucose, Bld 150 (*)    Creatinine, Ser 1.30 (*)    AST 13 (*)    All other components within normal limits  CBC WITH DIFFERENTIAL/PLATELET  LIPASE, BLOOD  URINALYSIS, ROUTINE W REFLEX MICROSCOPIC     EKG     RADIOLOGY  Ultrasound read by radiology reviewed by me shows stone in the neck of the gallbladder a report  possible slight gallbladder wall thickening cannot rule out cholecystitis  PROCEDURES:  Critical Care performed:   Procedures   MEDICATIONS ORDERED IN ED: Medications  fentaNYL (SUBLIMAZE) injection 50 mcg (50 mcg Intravenous Given 10/20/21 0700)  ondansetron (ZOFRAN) injection 4 mg (4 mg Intravenous Given 10/20/21 0700)     IMPRESSION / MDM / ASSESSMENT AND PLAN / ED COURSE  I reviewed the triage vital signs and the nursing notes. Patient with at least biliary colic if not cholecystitis.  I have called the surgeon he is in the operating room beginning to operate.  He will come down as soon as possible.  Patient's pain is currently controlled we will try to keep that from getting worse.  Differential diagnosis includes, but is not limited to, biliary colic or cholecystitis.  No sign of cholangitis.  Patient's presentation is most consistent with acute presentation with potential threat to life or bodily function.  The patient is on the cardiac monitor  to evaluate for evidence of arrhythmia and/or significant heart rate changes.  None have been seen      FINAL CLINICAL IMPRESSION(S) / ED DIAGNOSES   Final diagnoses:  Biliary colic   Possible early cholecystitis  Rx / DC Orders   ED Discharge Orders     None        Note:  This document was prepared using Dragon voice recognition software and may include unintentional dictation errors.   Arnaldo Natal, MD 10/20/21 [phone] EKG read and interpreted by me shows normal sinus rhythm rate of 66 normal axis no acute ST-T wave changes Patient to go up to preop now he is wondering if he can work his way up to surgery.  He was very interested in just half an hour ago.  He is going to go up to preop now.   Arnaldo Natal, MD 10/20/21 [phone]

## 2021-10-20 NOTE — Anesthesia Preprocedure Evaluation (Addendum)
Anesthesia Evaluation  Patient identified by MRN, date of birth, ID band Patient awake  General Assessment Comment:  Patient generally very nervous about surgery and anesthesia, has concerns that he won't wake up after or that his heart will stop.  Reviewed: Allergy & Precautions, NPO status , Patient's Chart, lab work & pertinent test results  History of Anesthesia Complications Negative for: history of anesthetic complications  Airway Mallampati: II  TM Distance: >3 FB Neck ROM: Full    Dental  (+) Poor Dentition, Chipped Patient says he has "really bad dental infection", unable to elucidate further. Denies any loose teeth however:   Pulmonary asthma , neg sleep apnea, COPD,  COPD inhaler, Patient abstained from smoking.Not current smoker,    Pulmonary exam normal breath sounds clear to auscultation       Cardiovascular Exercise Tolerance: Good METS(-) hypertension(-) CAD and (-) Past MI negative cardio ROS  (-) dysrhythmias  Rhythm:Regular Rate:Normal - Systolic murmurs    Neuro/Psych PSYCHIATRIC DISORDERS Anxiety Depression negative neurological ROS     GI/Hepatic neg GERD  ,(+)     (-) substance abuse  ,   Endo/Other  neg diabetes  Renal/GU negative Renal ROS     Musculoskeletal   Abdominal   Peds  Hematology   Anesthesia Other Findings Past Medical History: No date: Anxiety No date: Bronchitis  Reproductive/Obstetrics                            Anesthesia Physical Anesthesia Plan  ASA: 2  Anesthesia Plan: General   Post-op Pain Management: Ofirmev IV (intra-op)*   Induction: Intravenous and Rapid sequence  PONV Risk Score and Plan: 4 or greater and Ondansetron, Dexamethasone and Midazolam  Airway Management Planned: Oral ETT  Additional Equipment: None  Intra-op Plan:   Post-operative Plan: Extubation in OR  Informed Consent: I have reviewed the patients History  and Physical, chart, labs and discussed the procedure including the risks, benefits and alternatives for the proposed anesthesia with the patient or authorized representative who has indicated his/her understanding and acceptance.     Dental advisory given  Plan Discussed with: CRNA and Surgeon  Anesthesia Plan Comments: (Discussed risks of anesthesia with patient, including PONV, sore throat, lip/dental/eye damage. Rare risks discussed as well, such as cardiorespiratory and neurological sequelae, and allergic reactions. Discussed the role of CRNA in patient's perioperative care. Patient understands.)        Anesthesia Quick Evaluation

## 2021-10-20 NOTE — Anesthesia Postprocedure Evaluation (Signed)
Anesthesia Post Note  Patient: Kyle Mcmillan  Procedure(s) Performed: XI ROBOTIC ASSISTED LAPAROSCOPIC CHOLECYSTECTOMY (Abdomen) INDOCYANINE GREEN FLUORESCENCE IMAGING (ICG) (Abdomen)  Patient location during evaluation: PACU Anesthesia Type: General Level of consciousness: awake and alert Pain management: pain level controlled Vital Signs Assessment: post-procedure vital signs reviewed and stable Respiratory status: spontaneous breathing, nonlabored ventilation, respiratory function stable and patient connected to nasal cannula oxygen Cardiovascular status: blood pressure returned to baseline and stable Postop Assessment: no apparent nausea or vomiting Anesthetic complications: no   No notable events documented.   Last Vitals:  Vitals:   10/20/21 1345 10/20/21 1400  BP: (!) 143/85 (!) 155/100  Pulse: 81 (!) 103  Resp: 20 20  Temp:    SpO2: 100% 99%    Last Pain:  Vitals:   10/20/21 1332  TempSrc: Temporal  PainSc: Asleep                 Corinda Gubler

## 2021-10-20 NOTE — Anesthesia Procedure Notes (Addendum)
Procedure Name: Intubation Date/Time: 10/20/2021 11:55 AM  Performed by: Estanislado Emms, CRNAPre-anesthesia Checklist: Patient identified, Patient being monitored, Timeout performed, Emergency Drugs available and Suction available Patient Re-evaluated:Patient Re-evaluated prior to induction Oxygen Delivery Method: Circle system utilized Preoxygenation: Pre-oxygenation with 100% oxygen Induction Type: IV induction and Rapid sequence Laryngoscope Size: McGraph and 3 Grade View: Grade I Tube type: Oral Tube size: 7.5 mm Number of attempts: 1 Airway Equipment and Method: Stylet Placement Confirmation: ETT inserted through vocal cords under direct vision, positive ETCO2 and breath sounds checked- equal and bilateral Secured at: 21 cm Tube secured with: Tape Dental Injury: Teeth and Oropharynx as per pre-operative assessment

## 2021-10-23 LAB — SURGICAL PATHOLOGY

## 2021-11-06 ENCOUNTER — Encounter: Payer: Medicaid Other | Admitting: Physician Assistant

## 2021-11-13 ENCOUNTER — Encounter: Payer: Medicaid Other | Admitting: Physician Assistant

## 2022-04-14 ENCOUNTER — Other Ambulatory Visit: Payer: Self-pay

## 2022-04-14 ENCOUNTER — Emergency Department
Admission: EM | Admit: 2022-04-14 | Discharge: 2022-04-14 | Disposition: A | Discharge status: Home / Self Care | Payer: Medicaid Other | Attending: Emergency Medicine | Admitting: Emergency Medicine

## 2022-04-14 DIAGNOSIS — J309 Allergic rhinitis, unspecified: Secondary | ICD-10-CM | POA: Insufficient documentation

## 2022-04-14 DIAGNOSIS — K0889 Other specified disorders of teeth and supporting structures: Secondary | ICD-10-CM | POA: Diagnosis present

## 2022-04-14 DIAGNOSIS — K047 Periapical abscess without sinus: Secondary | ICD-10-CM | POA: Insufficient documentation

## 2022-04-14 HISTORY — DX: Chronic obstructive pulmonary disease, unspecified: J44.9

## 2022-04-14 MED ORDER — LIDOCAINE VISCOUS HCL 2 % MT SOLN: 10.0000 mL | OROMUCOSAL | 0 refills | Status: DC | PRN

## 2022-04-14 MED ORDER — DOXYCYCLINE HYCLATE 100 MG PO TABS: 100.0000 mg | ORAL_TABLET | Freq: Two times a day (BID) | ORAL | 0 refills | Status: DC

## 2022-04-14 MED ORDER — FLUTICASONE PROPIONATE 50 MCG/ACT NA SUSP: 1.0000 | Freq: Two times a day (BID) | NASAL | 0 refills | Status: DC

## 2022-04-14 MED ORDER — CETIRIZINE HCL 10 MG PO TABS: 10.0000 mg | ORAL_TABLET | Freq: Every day | ORAL | 0 refills | Status: AC

## 2022-04-14 NOTE — ED Provider Notes (Signed)
[facility] Provider Note  Patient Contact: 4:20 PM (approximate)   History   Dental Pain and Otalgia   HPI  Kyle Mcmillan is a 37 y.o. male who presents the emergency department complaining of bilateral dental pain, bilateral ear pain.  Patient states that his dental pain has been off and on for the last month.  It has been solid pain for the last 4 days.  Patient reports he has broken teeth and erosions.  Patient states that he has not seen a dentist.  No fevers or chills, difficulty breathing or swallowing.     Physical Exam   Triage Vital Signs: ED Triage Vitals  Enc Vitals Group     BP 04/14/22 1457 (!) 143/100     Pulse Rate 04/14/22 1457 85     Resp 04/14/22 1457 16     Temp 04/14/22 1457 98.1 F (36.7 C)     Temp Source 04/14/22 1457 Oral     SpO2 04/14/22 1457 97 %     Weight 04/14/22 1453 250 lb (113.4 kg)     Height 04/14/22 1453 5\' 11"  (1.803 m)     Head Circumference --      Peak Flow --      Pain Score 04/14/22 1453 5     Pain Loc --      Pain Edu? --      Excl. in [place]? --     Most recent vital signs: Vitals:   04/14/22 1457  BP: (!) 143/100  Pulse: 85  Resp: 16  Temp: 98.1 F (36.7 C)  SpO2: 97%     General: Alert and in no acute distress. ENT:      Ears: EACs unremarkable bilaterally.  TMs are slightly bulging bilaterally.      Nose: No congestion/rhinnorhea.      Mouth/Throat: Mucous membranes are moist.  Patient has missing dentition, poor dentition.  Patient has several erosions to the gumline with mild surrounding erythema.  No appreciable drainable abscess.  Tender over along the external mandible without palpable abnormality.  No submandibular erythema, edema, tenderness. Neck: No stridor. No cervical spine tenderness to palpation.  Cardiovascular:  Good peripheral perfusion Respiratory: Normal respiratory effort without tachypnea or retractions. Lungs CTAB.  Musculoskeletal: Full range of motion to all  extremities.  Neurologic:  No gross focal neurologic deficits are appreciated.  Skin:   No rash noted Other:   ED Results / Procedures / Treatments   Labs (all labs ordered are listed, but only abnormal results are displayed) Labs Reviewed - No data to display   EKG     RADIOLOGY    No results found.  PROCEDURES:  Critical Care performed: No  Procedures   MEDICATIONS ORDERED IN ED: Medications - No data to display   IMPRESSION / MDM / [place] / ED COURSE  I reviewed the triage vital signs and the nursing notes.                                 Differential diagnosis includes, but is not limited to, dental infection, dental abscess, broken dentition, otitis media, otitis externa, sinusitis  Patient's presentation is most consistent with acute presentation with potential threat to life or bodily function.   Patient's diagnosis is consistent with dental infection, allergic rhinitis.  Patient presents emergency department bilateral lower dental pain and bilateral ear pain.  Findings on physical exam  revealed dental infections bilaterally without drainable abscess.  No indication for labs or imaging.  Patient will have antibiotics for same.  Patient is also coming complaining of some ear pain and appears that patient has some allergic rhinitis with eustachian tube dysfunction.  Patient will have allergy medications for same.  Follow-up with a dentist.  Return precautions discussed with patient..  Patient is given ED precautions to return to the ED for any worsening or new symptoms.     FINAL CLINICAL IMPRESSION(S) / ED DIAGNOSES   Final diagnoses:  Dental infection  Allergic rhinitis, unspecified seasonality, unspecified trigger     Rx / DC Orders   ED Discharge Orders          Ordered    doxycycline (VIBRA-TABS) 100 MG tablet  2 times daily        04/14/22 1627    fluticasone (FLONASE) 50 MCG/ACT nasal spray  2 times daily        04/14/22 1627     cetirizine (ZYRTEC) 10 MG tablet  Daily        04/14/22 1627             Note:  This document was prepared using Dragon voice recognition software and may include unintentional dictation errors.   Brynda Peon 04/14/22 1628    Blake Divine, MD 04/14/22 [phone]

## 2022-04-14 NOTE — ED Triage Notes (Signed)
Pt to ED POV for dental pain to upper posterior teeth that radiates to both sides of face and both ears since 1 week. Does not know if has cavities to these teeth. Pain worse at night. Pt wants abx if needed to prevent getting worse.

## 2022-06-06 ENCOUNTER — Other Ambulatory Visit: Payer: Self-pay

## 2022-06-06 ENCOUNTER — Emergency Department
Admission: EM | Admit: 2022-06-06 | Discharge: 2022-06-06 | Disposition: A | Discharge status: Home / Self Care | Payer: Medicaid Other | Attending: Emergency Medicine | Admitting: Emergency Medicine

## 2022-06-06 DIAGNOSIS — J029 Acute pharyngitis, unspecified: Secondary | ICD-10-CM

## 2022-06-06 DIAGNOSIS — J449 Chronic obstructive pulmonary disease, unspecified: Secondary | ICD-10-CM | POA: Diagnosis not present

## 2022-06-06 DIAGNOSIS — Z1152 Encounter for screening for COVID-19: Secondary | ICD-10-CM | POA: Diagnosis not present

## 2022-06-06 DIAGNOSIS — J069 Acute upper respiratory infection, unspecified: Secondary | ICD-10-CM | POA: Diagnosis not present

## 2022-06-06 DIAGNOSIS — R059 Cough, unspecified: Secondary | ICD-10-CM | POA: Diagnosis present

## 2022-06-06 DIAGNOSIS — K047 Periapical abscess without sinus: Secondary | ICD-10-CM | POA: Diagnosis not present

## 2022-06-06 LAB — GROUP A STREP BY PCR: Group A Strep by PCR: NOT DETECTED

## 2022-06-06 LAB — RESP PANEL BY RT-PCR (RSV, FLU A&B, COVID)  RVPGX2
Influenza A by PCR: NEGATIVE
Influenza B by PCR: NEGATIVE
Resp Syncytial Virus by PCR: NEGATIVE
SARS Coronavirus 2 by RT PCR: NEGATIVE

## 2022-06-06 MED ORDER — CLINDAMYCIN HCL 300 MG PO CAPS: 300.0000 mg | ORAL_CAPSULE | Freq: Three times a day (TID) | ORAL | 0 refills | Status: AC

## 2022-06-06 MED ORDER — BENZONATATE 100 MG PO CAPS: ORAL_CAPSULE | ORAL | 0 refills | Status: DC

## 2022-06-06 MED ORDER — ALBUTEROL SULFATE HFA 108 (90 BASE) MCG/ACT IN AERS: 2.0000 | INHALATION_SPRAY | Freq: Four times a day (QID) | RESPIRATORY_TRACT | 2 refills | Status: AC | PRN

## 2022-06-06 MED ORDER — BENZONATATE 100 MG PO CAPS
200.0000 mg | ORAL_CAPSULE | Freq: Once | ORAL | Status: AC
  Administered 2022-06-06: 200 mg via ORAL
  Filled 2022-06-06: qty 2

## 2022-06-06 MED ORDER — CLINDAMYCIN HCL 150 MG PO CAPS
300.0000 mg | ORAL_CAPSULE | Freq: Once | ORAL | Status: AC
  Administered 2022-06-06: 300 mg via ORAL
  Filled 2022-06-06: qty 2

## 2022-06-06 NOTE — ED Provider Notes (Signed)
[facility] Emergency Department Provider Note     Event Date/Time   First MD Initiated Contact with Patient 06/06/22 1959     (approximate)   History   Sore Throat and Cough   HPI  Kyle Mcmillan is a 37 y.o. male with a history of chronic bronchitis and COPD, not currently on any medication management, presents to the ED with complaints of sore throat, cough, congestion.  Patient also endorses some pain and fullness to a chronically broken left lower molar, was seen concern for possible infection.  Patient reports onset of symptoms since Saturday.  He also reports his sister recently tested positive for flu.  He denies any frank fevers, chest pain, or shortness of breath.  Also denies any nausea, vomiting, or diarrhea.  Physical Exam   Triage Vital Signs: ED Triage Vitals  Enc Vitals Group     BP 06/06/22 1940 (!) 146/92     Pulse Rate 06/06/22 1940 (!) 105     Resp 06/06/22 1940 20     Temp 06/06/22 1940 99.2 F (37.3 C)     Temp Source 06/06/22 1940 Oral     SpO2 06/06/22 1940 100 %     Weight 06/06/22 1937 258 lb (117 kg)     Height 06/06/22 1937 '5\' 11"'$  (1.803 m)     Head Circumference --      Peak Flow --      Pain Score 06/06/22 1937 5     Pain Loc --      Pain Edu? --      Excl. in [place]? --     Most recent vital signs: Vitals:   06/06/22 1940  BP: (!) 146/92  Pulse: (!) 105  Resp: 20  Temp: 99.2 F (37.3 C)  SpO2: 100%    General Awake, no distress. NAD HEENT NCAT. PERRL. EOMI. No rhinorrhea. Mucous membranes are moist.  Uvula is midline and tonsils are flat.  Left lower jaw with a chronically broken left second molar down to the gumline.  Mild gingival edema is appreciated.  No pointing, fluctuance, purulent drainage is noted.  No oropharyngeal lesions appreciated.  EMs intact bilaterally without serous or purulent effusions. CV:  Good peripheral perfusion.  RESP:  Normal effort.  ABD:  No distention.    ED Results /  Procedures / Treatments   Labs (all labs ordered are listed, but only abnormal results are displayed) Labs Reviewed  GROUP A STREP BY PCR  RESP PANEL BY RT-PCR (RSV, FLU A&B, COVID)  [id number]     EKG   RADIOLOGY No results found.   PROCEDURES:  Critical Care performed: No  Procedures   MEDICATIONS ORDERED IN ED: Medications  clindamycin (CLEOCIN) capsule 300 mg (has no administration in time range)  benzonatate (TESSALON) capsule 200 mg (has no administration in time range)     IMPRESSION / MDM / ASSESSMENT AND PLAN / ED COURSE  I reviewed the triage vital signs and the nursing notes.                              Differential diagnosis includes, but is not limited to, COVID, flu, RSV, strep pharyngitis, viral URI, bronchitis with dental infection  Patient's presentation is most consistent with acute complicated illness / injury requiring diagnostic workup.  Patient's diagnosis is consistent with viral URI with cough, and early dental infection.  Patient's viral panel testing is negative  at this time.  Strep test is also negative at this time.  Patient will be discharged home with prescriptions for clindamycin, albuterol inhaler, and Tessalon Perles. Patient is to follow up with primary provider or local urgent care as needed or otherwise directed. Patient is given ED precautions to return to the ED for any worsening or new symptoms.  FINAL CLINICAL IMPRESSION(S) / ED DIAGNOSES   Final diagnoses:  Viral URI with cough  Sore throat  Dental infection     Rx / DC Orders   ED Discharge Orders          Ordered    clindamycin (CLEOCIN) 300 MG capsule  3 times daily        06/06/22 2132    benzonatate (TESSALON PERLES) 100 MG capsule        06/06/22 2132    albuterol (VENTOLIN HFA) 108 (90 Base) MCG/ACT inhaler  Every 6 hours PRN        06/06/22 2132             Note:  This document was prepared using Dragon voice recognition software and may include  unintentional dictation errors.    Melvenia Needles, PA-C 06/06/22 2136    Nance Pear, MD 06/06/22 2158

## 2022-06-06 NOTE — ED Notes (Signed)
Pt verbalizes understanding of discharge instructions. Opportunity for questioning and answers were provided. Pt discharged from ED to home with friend.

## 2022-06-06 NOTE — Discharge Instructions (Addendum)
Take the prescription meds as directed.  Follow-up with your primary provider or local community clinic for ongoing symptoms.

## 2022-06-06 NOTE — ED Triage Notes (Signed)
Pt to ED via POV c/o sore throat, cough and congestion since Saturday, pt reports sister tested positive for flu.

## 2022-12-27 ENCOUNTER — Other Ambulatory Visit: Payer: Self-pay

## 2022-12-27 ENCOUNTER — Encounter (HOSPITAL_COMMUNITY): Payer: Self-pay | Admitting: *Deleted

## 2022-12-27 ENCOUNTER — Emergency Department (HOSPITAL_COMMUNITY)
Admission: EM | Admit: 2022-12-27 | Discharge: 2022-12-28 | Disposition: A | Discharge status: Home / Self Care | Payer: MEDICAID | Attending: Emergency Medicine | Admitting: Emergency Medicine

## 2022-12-27 DIAGNOSIS — R109 Unspecified abdominal pain: Secondary | ICD-10-CM | POA: Insufficient documentation

## 2022-12-27 LAB — COMPREHENSIVE METABOLIC PANEL
ALT: 20 U/L (ref 0–44)
AST: 14 U/L — ABNORMAL LOW (ref 15–41)
Albumin: 4 g/dL (ref 3.5–5.0)
Alkaline Phosphatase: 60 U/L (ref 38–126)
Anion gap: 8 (ref 5–15)
BUN: 11 mg/dL (ref 6–20)
CO2: 26 mmol/L (ref 22–32)
Calcium: 8.9 mg/dL (ref 8.9–10.3)
Chloride: 103 mmol/L (ref 98–111)
Creatinine, Ser: 1.11 mg/dL (ref 0.61–1.24)
GFR, Estimated: >60 mL/min (ref >60)
Glucose, Bld: 100 mg/dL — ABNORMAL HIGH (ref 70–99)
Potassium: 4 mmol/L (ref 3.5–5.1)
Sodium: 137 mmol/L (ref 135–145)
Total Bilirubin: 0.8 mg/dL (ref 0.3–1.2)
Total Protein: 6.7 g/dL (ref 6.5–8.1)

## 2022-12-27 LAB — URINALYSIS, ROUTINE W REFLEX MICROSCOPIC
Bacteria, UA: NONE SEEN
Bilirubin Urine: NEGATIVE
Glucose, UA: NEGATIVE mg/dL
Hgb urine dipstick: NEGATIVE
Ketones, ur: NEGATIVE mg/dL
Nitrite: NEGATIVE
Protein, ur: NEGATIVE mg/dL
Specific Gravity, Urine: 1.016 (ref 1.005–1.030)
pH: 5 (ref 5.0–8.0)

## 2022-12-27 LAB — LIPASE, BLOOD: Lipase: 27 U/L (ref 11–51)

## 2022-12-27 LAB — CBC
HCT: 44.7 % (ref 39.0–52.0)
Hemoglobin: 15.6 g/dL (ref 13.0–17.0)
MCH: 30.5 pg (ref 26.0–34.0)
MCHC: 34.9 g/dL (ref 30.0–36.0)
MCV: 87.5 fL (ref 80.0–100.0)
Platelets: 273 10*3/uL (ref 150–400)
RBC: 5.11 MIL/uL (ref 4.22–5.81)
RDW: 13.2 % (ref 11.5–15.5)
WBC: 8.7 10*3/uL (ref 4.0–10.5)
nRBC: 0 % (ref 0.0–0.2)

## 2022-12-27 NOTE — ED Triage Notes (Signed)
Pt with abd pain and lower back pain x 1.5 weeks. + nausea and diarrhea, denies any emesis. Denies any blood in urine.

## 2022-12-27 NOTE — ED Provider Notes (Signed)
[place] EMERGENCY DEPARTMENT AT [facility] Provider Note   CSN: [id number] Arrival date & time: 12/27/22  1958     History  Chief Complaint  Patient presents with   Abdominal Pain    Kyle Mcmillan is a 37 y.o. male.  Patient is a 37 year old male with no significant past medical history.  Patient presenting today with complaints of left flank pain.  This has been worsening over the past week and began in the absence of any injury or trauma.  He describes an intermittent pain to the left lower lumbar region and flank area.  This is worse when he moves or changes position.  No bowel or bladder complaints.  He denies any radiation into his legs or bowel or bladder complaints.  No weakness or numbness of the leg.  He reports taking naproxen with little relief.  The history is provided by the patient.       Home Medications Prior to Admission medications   Medication Sig Start Date End Date Taking? Authorizing Provider  albuterol (VENTOLIN HFA) 108 (90 Base) MCG/ACT inhaler Inhale 2 puffs into the lungs every 6 (six) hours as needed for wheezing or shortness of breath. 06/06/22   Menshew, Charlesetta Ivory, PA-C  benzonatate (TESSALON PERLES) 100 MG capsule Take 1-2 tabs TID prn cough 06/06/22   Menshew, Charlesetta Ivory, PA-C  busPIRone (BUSPAR) 5 MG tablet Take 1 tablet (5 mg total) by mouth 3 (three) times daily. 01/24/20   Clapacs, Jackquline Denmark, MD  cetirizine (ZYRTEC) 10 MG tablet Take 1 tablet (10 mg total) by mouth daily. 04/14/22   Cuthriell, Delorise Royals, PA-C  fluticasone (FLONASE) 50 MCG/ACT nasal spray Place 1 spray into both nostrils 2 (two) times daily. 04/14/22   Cuthriell, Delorise Royals, PA-C  mirtazapine (REMERON) 15 MG tablet Take 1 tablet (15 mg total) by mouth at bedtime. 01/24/20   Clapacs, Jackquline Denmark, MD      Allergies    Ibuprofen, Penicillins, Phenobarbital, and Tetanus toxoids    Review of Systems   Review of Systems  All other systems reviewed and are  negative.   Physical Exam Updated Vital Signs BP (!) 144/83   Pulse 81   Temp 98.2 F (36.8 C) (Oral)   Resp 18   Ht 5\' 10"  (1.778 m)   Wt 120.2 kg   SpO2 99%   BMI 38.02 kg/m  Physical Exam Vitals and nursing note reviewed.  Constitutional:      General: He is not in acute distress.    Appearance: He is well-developed. He is not diaphoretic.  HENT:     Head: Normocephalic and atraumatic.  Cardiovascular:     Rate and Rhythm: Normal rate and regular rhythm.     Heart sounds: No murmur heard.    No friction rub.  Pulmonary:     Effort: Pulmonary effort is normal. No respiratory distress.     Breath sounds: Normal breath sounds. No wheezing or rales.  Abdominal:     General: Abdomen is flat. Bowel sounds are normal. There is no distension.     Palpations: Abdomen is soft.     Tenderness: There is no abdominal tenderness. There is left CVA tenderness. There is no guarding or rebound.  Musculoskeletal:        General: Normal range of motion.     Cervical back: Normal range of motion and neck supple.  Skin:    General: Skin is warm and dry.  Neurological:  Mental Status: He is alert and oriented to person, place, and time.     Coordination: Coordination normal.     Comments: Strength is 5 out of 5 in both lower extremities.  DTRs are 2+ and symmetrical in the patellar and Achilles tendons bilaterally.  Strength is 5 out of 5 in both lower extremities.     ED Results / Procedures / Treatments   Labs (all labs ordered are listed, but only abnormal results are displayed) Labs Reviewed  COMPREHENSIVE METABOLIC PANEL - Abnormal; Notable for the following components:      Result Value   Glucose, Bld 100 (*)    AST 14 (*)    All other components within normal limits  URINALYSIS, ROUTINE W REFLEX MICROSCOPIC - Abnormal; Notable for the following components:   Leukocytes,Ua TRACE (*)    All other components within normal limits  LIPASE, BLOOD  CBC     EKG None  Radiology No results found.  Procedures Procedures  {Document cardiac monitor, telemetry assessment procedure when appropriate:1}  Medications Ordered in ED Medications - No data to display  ED Course/ Medical Decision Making/ A&P   {   Click here for ABCD2, HEART and other calculatorsREFRESH Note before signing :1}                              Medical Decision Making Amount and/or Complexity of Data Reviewed Labs: ordered. Radiology: ordered.   ***  {Document critical care time when appropriate:1} {Document review of labs and clinical decision tools ie heart score, Chads2Vasc2 etc:1}  {Document your independent review of radiology images, and any outside records:1} {Document your discussion with family members, caretakers, and with consultants:1} {Document social determinants of health affecting pt's care:1} {Document your decision making why or why not admission, treatments were needed:1} Final Clinical Impression(s) / ED Diagnoses Final diagnoses:  None    Rx / DC Orders ED Discharge Orders     None

## 2022-12-28 ENCOUNTER — Emergency Department (HOSPITAL_COMMUNITY): Payer: MEDICAID

## 2022-12-28 MED ORDER — CYCLOBENZAPRINE HCL 10 MG PO TABS: 10.0000 mg | ORAL_TABLET | Freq: Three times a day (TID) | ORAL | 0 refills | Status: DC | PRN

## 2022-12-28 NOTE — ED Notes (Signed)
Pt back from CT

## 2022-12-28 NOTE — Discharge Instructions (Signed)
Continue taking naproxen as before.  Begin taking Flexeril as prescribed as needed for pain not relieved with naproxen.  Follow-up with your primary doctor if not improving in the next week.

## 2023-01-28 ENCOUNTER — Ambulatory Visit
Admission: EM | Admit: 2023-01-28 | Discharge: 2023-01-28 | Disposition: A | Discharge status: Home / Self Care | Payer: MEDICAID | Attending: Family Medicine | Admitting: Family Medicine

## 2023-01-28 DIAGNOSIS — H6993 Unspecified Eustachian tube disorder, bilateral: Secondary | ICD-10-CM

## 2023-01-28 DIAGNOSIS — K047 Periapical abscess without sinus: Secondary | ICD-10-CM | POA: Diagnosis not present

## 2023-01-28 MED ORDER — FLUTICASONE PROPIONATE 50 MCG/ACT NA SUSP: 1.0000 | Freq: Two times a day (BID) | NASAL | 2 refills | Status: AC

## 2023-01-28 MED ORDER — CLINDAMYCIN HCL 300 MG PO CAPS: 300.0000 mg | ORAL_CAPSULE | Freq: Two times a day (BID) | ORAL | 0 refills | Status: DC

## 2023-01-28 MED ORDER — CHLORHEXIDINE GLUCONATE 0.12 % MT SOLN: 15.0000 mL | Freq: Two times a day (BID) | OROMUCOSAL | 0 refills | Status: DC

## 2023-01-28 NOTE — ED Triage Notes (Signed)
Pt c/o bilateral ear pain and tooth pain, x 1 week pt is unsure if the pain is correlated
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
## 2023-07-19 ENCOUNTER — Emergency Department (HOSPITAL_COMMUNITY): Payer: MEDICAID

## 2023-07-19 ENCOUNTER — Encounter (HOSPITAL_COMMUNITY): Payer: Self-pay

## 2023-07-19 ENCOUNTER — Emergency Department (HOSPITAL_COMMUNITY)
Admission: EM | Admit: 2023-07-19 | Discharge: 2023-07-19 | Disposition: A | Discharge status: Home / Self Care | Payer: MEDICAID | Attending: Emergency Medicine | Admitting: Emergency Medicine

## 2023-07-19 ENCOUNTER — Other Ambulatory Visit: Payer: Self-pay

## 2023-07-19 DIAGNOSIS — I471 Supraventricular tachycardia, unspecified: Secondary | ICD-10-CM | POA: Diagnosis not present

## 2023-07-19 DIAGNOSIS — R0602 Shortness of breath: Secondary | ICD-10-CM | POA: Diagnosis present

## 2023-07-19 LAB — BASIC METABOLIC PANEL WITH GFR
Anion gap: 7 (ref 5–15)
BUN: 18 mg/dL (ref 6–20)
CO2: 24 mmol/L (ref 22–32)
Calcium: 8.9 mg/dL (ref 8.9–10.3)
Chloride: 105 mmol/L (ref 98–111)
Creatinine, Ser: 1.32 mg/dL — ABNORMAL HIGH (ref 0.61–1.24)
GFR, Estimated: >60 mL/min (ref >60)
Glucose, Bld: 103 mg/dL — ABNORMAL HIGH (ref 70–99)
Potassium: 3.6 mmol/L (ref 3.5–5.1)
Sodium: 136 mmol/L (ref 135–145)

## 2023-07-19 LAB — CBC
HCT: 44.8 % (ref 39.0–52.0)
Hemoglobin: 15.3 g/dL (ref 13.0–17.0)
MCH: 29.6 pg (ref 26.0–34.0)
MCHC: 34.2 g/dL (ref 30.0–36.0)
MCV: 86.7 fL (ref 80.0–100.0)
Platelets: 269 10*3/uL (ref 150–400)
RBC: 5.17 MIL/uL (ref 4.22–5.81)
RDW: 13.2 % (ref 11.5–15.5)
WBC: 11.7 10*3/uL — ABNORMAL HIGH (ref 4.0–10.5)
nRBC: 0 % (ref 0.0–0.2)

## 2023-07-19 LAB — HEPATIC FUNCTION PANEL
ALT: 24 U/L (ref 0–44)
AST: 17 U/L (ref 15–41)
Albumin: 4.1 g/dL (ref 3.5–5.0)
Alkaline Phosphatase: 71 U/L (ref 38–126)
Bilirubin, Direct: 0.1 mg/dL (ref 0.0–0.2)
Indirect Bilirubin: 0.5 mg/dL (ref 0.3–0.9)
Total Bilirubin: 0.6 mg/dL (ref 0.0–1.2)
Total Protein: 7.1 g/dL (ref 6.5–8.1)

## 2023-07-19 LAB — RAPID URINE DRUG SCREEN, HOSP PERFORMED
Amphetamines: NOT DETECTED
Barbiturates: NOT DETECTED
Benzodiazepines: NOT DETECTED
Cocaine: NOT DETECTED
Opiates: NOT DETECTED
Tetrahydrocannabinol: NOT DETECTED

## 2023-07-19 LAB — TSH: TSH: 2.141 u[IU]/mL (ref 0.350–4.500)

## 2023-07-19 MED ORDER — METOPROLOL TARTRATE 25 MG PO TABS: 25.0000 mg | ORAL_TABLET | Freq: Two times a day (BID) | ORAL | 0 refills | Status: DC

## 2023-07-19 MED ORDER — METOPROLOL TARTRATE 50 MG PO TABS
50.0000 mg | ORAL_TABLET | Freq: Once | ORAL | Status: AC
  Administered 2023-07-19: 50 mg via ORAL
  Filled 2023-07-19: qty 1

## 2023-07-19 NOTE — Discharge Instructions (Signed)
 All of your testing has been reassuring, I encourage you to get better sleep, that may mean taking a Benadryl or some melatonin at night, you will need to follow-up with a cardiologist for cardiac monitoring.  Until that time please take metoprolol  25 mg by mouth twice a day to help keep this from happening again.  If it does happen again return to the ER immediately

## 2023-07-19 NOTE — ED Provider Notes (Signed)
 [place] EMERGENCY DEPARTMENT AT [facility] Provider Note   CSN: [id number] Arrival date & time: 07/19/23  1611     History  Chief Complaint  Patient presents with   Tachycardia    Kyle Mcmillan is a 38 y.o. male.  HPI   This patient is a 38 year old male who endorses a history of anxiety, untreated, denies alcohol use, tobacco use or over-the-counter cough and cold medication use.  He does drink caffeine multiple times throughout the day but denies energy drinks.  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  Today while he was sitting on the couch he became short of breath spontaneously, when he went to get in the car to go run an errand he started to feel palpitations and paramedics were called.  The patient was found to be in SVT at a rate of about 210 bpm, with vagal maneuvers the patient converted to normal sinus rhythm and a mild sinus tachycardia at about 110 bpm.  He states he is symptom-free at this time.  He has gained about 80 pounds in the last year, he denies nausea vomiting fevers chills coughing swelling or rashes.  He has no exertional chest pain.  He has never seen a cardiologist.  Home Medications Prior to Admission medications   Medication Sig Start Date End Date Taking? Authorizing Provider  metoprolol  tartrate (LOPRESSOR ) 25 MG tablet Take 1 tablet (25 mg total) by mouth 2 (two) times daily. 07/19/23 08/18/23 Yes Early Glisson, MD  albuterol  (VENTOLIN  HFA) 108 [phone] Base) MCG/ACT inhaler Inhale 2 puffs into the lungs every 6 (six) hours as needed for wheezing or shortness of breath. 06/06/22   Menshew, Raye Cai, PA-C  benzonatate  (TESSALON  PERLES) 100 MG capsule Take 1-2 tabs TID prn cough 06/06/22   Menshew, Raye Cai, PA-C  busPIRone  (BUSPAR ) 5 MG tablet Take 1 tablet (5 mg total) by mouth 3 (three) times daily. 01/24/20   Clapacs, Elida Grounds, MD   cetirizine  (ZYRTEC ) 10 MG tablet Take 1 tablet (10 mg total) by mouth daily. 04/14/22   Cuthriell, Ardath Bears, PA-C  chlorhexidine  (PERIDEX ) 0.12 % solution Use as directed 15 mLs in the mouth or throat 2 (two) times daily. 01/28/23   Corbin Dess, PA-C  clindamycin  (CLEOCIN ) 300 MG capsule Take 1 capsule (300 mg total) by mouth 2 (two) times daily. 01/28/23   Corbin Dess, PA-C  cyclobenzaprine  (FLEXERIL ) 10 MG tablet Take 1 tablet (10 mg total) by mouth 3 (three) times daily as needed for muscle spasms. 12/28/22   Orvilla Blander, MD  fluticasone  (FLONASE ) 50 MCG/ACT nasal spray Place 1 spray into both nostrils 2 (two) times daily. 01/28/23   Corbin Dess, PA-C  mirtazapine  (REMERON ) 15 MG tablet Take 1 tablet (15 mg total) by mouth at bedtime. 01/24/20   Clapacs, Elida Grounds, MD      Allergies    Ibuprofen , Penicillins, Phenobarbital, and Tetanus toxoids    Review of Systems   Review of Systems  All other systems reviewed and are negative.   Physical Exam Updated Vital Signs BP (!) 139/90   Pulse (!) 106   Temp 98.2 F (36.8 C) (Oral)   Resp 18   Ht 1.778 m (5\' 10" )   Wt 131.5 kg   SpO2 95%   BMI 41.61 kg/m  Physical Exam Vitals and nursing note reviewed.  Constitutional:      General: He is not in acute distress.    Appearance: He is well-developed.  HENT:     Head: Normocephalic and atraumatic.     Mouth/Throat:     Pharynx: No oropharyngeal exudate.  Eyes:     General: No scleral icterus.       Right eye: No discharge.        Left eye: No discharge.     Conjunctiva/sclera: Conjunctivae normal.     Pupils: Pupils are equal, round, and reactive to light.  Neck:     Thyroid : No thyromegaly.     Vascular: No JVD.  Cardiovascular:     Rate and Rhythm: Regular rhythm. Tachycardia present.     Heart sounds: Normal heart sounds. No murmur heard.    No friction rub. No gallop.     Comments: Heart rate of 110 bpm with normal pulses no edema no  JVD Pulmonary:     Effort: Pulmonary effort is normal. No respiratory distress.     Breath sounds: Normal breath sounds. No wheezing or rales.  Abdominal:     General: Bowel sounds are normal. There is no distension.     Palpations: Abdomen is soft. There is no mass.     Tenderness: There is no abdominal tenderness.  Musculoskeletal:        General: No tenderness. Normal range of motion.     Cervical back: Normal range of motion and neck supple.     Right lower leg: No edema.     Left lower leg: No edema.  Lymphadenopathy:     Cervical: No cervical adenopathy.  Skin:    General: Skin is warm and dry.     Findings: No erythema or rash.  Neurological:     General: No focal deficit present.     Mental Status: He is alert.     Coordination: Coordination normal.  Psychiatric:        Behavior: Behavior normal.     ED Results / Procedures / Treatments   Labs (all labs ordered are listed, but only abnormal results are displayed) Labs Reviewed  CBC - Abnormal; Notable for the following components:      Result Value   WBC 11.7 (*)    All other components within normal limits  BASIC METABOLIC PANEL WITH GFR - Abnormal; Notable for the following components:   Glucose, Bld 103 (*)    Creatinine, Ser 1.32 (*)    All other components within normal limits  HEPATIC FUNCTION PANEL  TSH  RAPID URINE DRUG SCREEN, HOSP PERFORMED    EKG EKG Interpretation Date/Time:  Saturday July 19 2023 16:17:51 EDT Ventricular Rate:  105 PR Interval:  169 QRS Duration:  98 QT Interval:  340 QTC Calculation: 450 R Axis:   90  Text Interpretation: Sinus tachycardia Borderline right axis deviation Since last tracing rate faster Confirmed by Early Glisson ([id number]) on 07/19/2023 4:35:06 PM  Radiology DG Chest Port 1 View Result Date: 07/19/2023 CLINICAL DATA:  sob EXAM: PORTABLE CHEST - 1 VIEW COMPARISON:  08/23/2021 FINDINGS: Lungs are clear. Heart size and mediastinal contours are within normal  limits. No effusion. Visualized bones unremarkable. IMPRESSION: No acute cardiopulmonary disease. Electronically Signed   By: Nicoletta Barrier M.D.   On: 07/19/2023 18:51    Procedures Procedures    Medications Ordered in ED Medications  metoprolol  tartrate (LOPRESSOR ) tablet 50 mg (50 mg Oral Given 07/19/23 1647)    ED Course/ Medical Decision Making/  A&P                                 Medical Decision Making Amount and/or Complexity of Data Reviewed Labs: ordered. Radiology: ordered.  Risk Prescription drug management.    This patient presents to the ED for concern of tachyarrhythmia, this involves an extensive number of treatment options, and is a complaint that carries with it a high risk of complications and morbidity.  The differential diagnosis includes thyroid  dysfunction, drug use, idiopathic, could be related to anxiety, substances, states he does not sleep very well and insomnia concern may contribute   Co morbidities that complicate the patient evaluation  Morbid obesity   Additional history obtained:  Additional history obtained from medical record External records from outside source obtained and reviewed including admitted for cholecystitis in July 2023   Lab Tests:  I Ordered, and personally interpreted labs.  The pertinent results include: Unremarkable thyroid  function metabolic panel and CBC   Imaging Studies ordered:  I ordered imaging studies including chest x-ray I independently visualized and interpreted imaging which showed no acute findings I agree with the radiologist interpretation   Cardiac Monitoring: / EKG:  The patient was maintained on a cardiac monitor.  I personally viewed and interpreted the cardiac monitored which showed an underlying rhythm of: Normal sinus rhythm   Problem List / ED Course / Critical interventions / Medication management  The patient has been given a dose of metoprolol , heart rate is improved, he appears  underlying mild anxiety, no recurrent arrhythmias in the ED, will prescribe metoprolol  for home.  Referred to outpatient cardiology, patient agreeable   Social Determinants of Health:  Anxiety   Test / Admission - Considered:  Third admission but no recurrent arrhythmias and very stable appearing for discharge         Final Clinical Impression(s) / ED Diagnoses Final diagnoses:  SVT (supraventricular tachycardia) (HCC)    Rx / DC Orders ED Discharge Orders          Ordered    metoprolol  tartrate (LOPRESSOR ) 25 MG tablet  2 times daily        07/19/23 1904              Early Glisson, MD 07/19/23 1905

## 2023-07-19 NOTE — ED Triage Notes (Signed)
 Pt reports feeling his heart race and feeling short of breath.  EMS reports pt in SVT at a rate over 200.

## 2023-07-21 NOTE — Progress Notes (Deleted)
  Cardiology Office Note:  .   Date:  07/21/2023  ID:  Kyle Mcmillan, DOB 06-Dec-1985, MRN [id number] PCP: Glover Larve, MD  [facility] Providers Cardiologist:  None { Click to update primary MD,subspecialty MD or APP then REFRESH:1}   History of Present Illness: .   No chief complaint on file.   Kyle Mcmillan is a 38 y.o. male with history of asthma who presents for the evaluation of SVT at the request of Glover Larve, MD. Seen in ER 4/19 for SVT with HR >200 bpm. Terminated with vagal maneuvers.   TSH 2.14    ***{There is no content from the last Narrative History section.}    ROS: All other ROS reviewed and negative. Pertinent positives noted in the HPI.     Studies Reviewed: Aaron Aas       Physical Exam:   VS:  There were no vitals taken for this visit.   Wt Readings from Last 3 Encounters:  07/19/23 290 lb (131.5 kg)  12/27/22 265 lb (120.2 kg)  06/06/22 258 lb (117 kg)    GEN: Well nourished, well developed in no acute distress NECK: No JVD; No carotid bruits CARDIAC: ***RRR, no murmurs, rubs, gallops RESPIRATORY:  Clear to auscultation without rales, wheezing or rhonchi  ABDOMEN: Soft, non-tender, non-distended EXTREMITIES:  No edema; No deformity  ASSESSMENT AND PLAN: .   ***    {Are you ordering a CV Procedure (e.g. stress test, cath, DCCV, TEE, etc)?   Press F2        :[id number]}   Follow-up: No follow-ups on file.  Time Spent with Patient: I have spent a total of *** minutes caring for this patient today face to face, ordering and reviewing labs/tests, reviewing prior records/medical history, examining the patient, establishing an assessment and plan, communicating results/findings to the patient/family, and documenting in the medical record.   Signed, Gigi Kyle. Rolm Clos, MD, [facility]  [facility]  [address] [place], [place] [address] [phone]  10:13 PM

## 2023-07-22 ENCOUNTER — Ambulatory Visit (HOSPITAL_BASED_OUTPATIENT_CLINIC_OR_DEPARTMENT_OTHER): Payer: MEDICAID | Admitting: Cardiovascular Disease

## 2023-07-22 DIAGNOSIS — I471 Supraventricular tachycardia, unspecified: Secondary | ICD-10-CM

## 2023-09-27 ENCOUNTER — Other Ambulatory Visit: Payer: Self-pay

## 2023-09-27 ENCOUNTER — Emergency Department (HOSPITAL_COMMUNITY)
Admission: EM | Admit: 2023-09-27 | Discharge: 2023-09-27 | Disposition: A | Discharge status: Home / Self Care | Payer: MEDICAID | Attending: Emergency Medicine | Admitting: Emergency Medicine

## 2023-09-27 ENCOUNTER — Encounter (HOSPITAL_COMMUNITY): Payer: Self-pay

## 2023-09-27 DIAGNOSIS — H9203 Otalgia, bilateral: Secondary | ICD-10-CM | POA: Diagnosis not present

## 2023-09-27 DIAGNOSIS — K047 Periapical abscess without sinus: Secondary | ICD-10-CM | POA: Insufficient documentation

## 2023-09-27 DIAGNOSIS — K0889 Other specified disorders of teeth and supporting structures: Secondary | ICD-10-CM | POA: Diagnosis present

## 2023-09-27 MED ORDER — CHLORHEXIDINE GLUCONATE 0.12 % MT SOLN: 15.0000 mL | Freq: Two times a day (BID) | OROMUCOSAL | 0 refills | Status: DC

## 2023-09-27 MED ORDER — CLINDAMYCIN HCL 150 MG PO CAPS
300.0000 mg | ORAL_CAPSULE | Freq: Once | ORAL | Status: AC
  Administered 2023-09-27: 300 mg via ORAL
  Filled 2023-09-27: qty 2

## 2023-09-27 MED ORDER — CLINDAMYCIN HCL 300 MG PO CAPS: 300.0000 mg | ORAL_CAPSULE | Freq: Four times a day (QID) | ORAL | 0 refills | Status: DC

## 2023-09-27 NOTE — Discharge Instructions (Addendum)
 Please follow-up closely with your primary care doctor and a dentist on an outpatient basis.  Return to emergency department immediately for any new or worsening symptoms.

## 2023-09-27 NOTE — ED Provider Notes (Signed)
 [place] EMERGENCY DEPARTMENT AT [facility] Provider Note   CSN: [id number] Arrival date & time: 09/27/23  1627     Patient presents with: Dental Pain   Kyle Mcmillan is a 38 y.o. male.   Patient is a 39 year old male who presents to the emergency department with a chief complaint of bilateral ear pain and dental pain which has been ongoing for about the past month.  He notes that he has had no associated fever, chills, chest pain, shortness of breath.  He denies any stridor, dysphagia, drooling, dyspnea.   Dental Pain      Prior to Admission medications   Medication Sig Start Date End Date Taking? Authorizing Provider  chlorhexidine  (PERIDEX ) 0.12 % solution Use as directed 15 mLs in the mouth or throat 2 (two) times daily. 09/27/23  Yes Daralene Bruckner D, PA-C  clindamycin  (CLEOCIN ) 300 MG capsule Take 1 capsule (300 mg total) by mouth 4 (four) times daily for 10 days. 09/27/23 10/07/23 Yes Kaylyne Axton, Bruckner BIRCH, PA-C  albuterol  (VENTOLIN  HFA) 108 (90 Base) MCG/ACT inhaler Inhale 2 puffs into the lungs every 6 (six) hours as needed for wheezing or shortness of breath. 06/06/22   Menshew, Candida LULLA Kings, PA-C  benzonatate  (TESSALON  PERLES) 100 MG capsule Take 1-2 tabs TID prn cough 06/06/22   Menshew, Jenise V Bacon, PA-C  busPIRone  (BUSPAR ) 5 MG tablet Take 1 tablet (5 mg total) by mouth 3 (three) times daily. 01/24/20   Clapacs, Norleen DASEN, MD  cetirizine  (ZYRTEC ) 10 MG tablet Take 1 tablet (10 mg total) by mouth daily. 04/14/22   Cuthriell, Dorn BIRCH, PA-C  cyclobenzaprine  (FLEXERIL ) 10 MG tablet Take 1 tablet (10 mg total) by mouth 3 (three) times daily as needed for muscle spasms. 12/28/22   Geroldine Berg, MD  fluticasone  (FLONASE ) 50 MCG/ACT nasal spray Place 1 spray into both nostrils 2 (two) times daily. 01/28/23   Stuart Vernell Norris, PA-C  metoprolol  tartrate (LOPRESSOR ) 25 MG tablet Take 1 tablet (25 mg total) by mouth 2 (two) times daily. 07/19/23 08/18/23  Cleotilde Rogue, MD  mirtazapine  (REMERON ) 15 MG tablet Take 1 tablet (15 mg total) by mouth at bedtime. 01/24/20   Clapacs, Norleen DASEN, MD    Allergies: Ibuprofen , Penicillins, Phenobarbital, and Tetanus toxoids    Review of Systems  HENT:  Positive for dental problem and ear pain.   All other systems reviewed and are negative.   Updated Vital Signs BP (!) 147/86 (BP Location: Right Arm)   Pulse 100   Temp 98.7 F (37.1 C) (Oral)   Resp 18   Ht 5' 11 (1.803 m)   Wt 131.5 kg   SpO2 97%   BMI 40.45 kg/m   Physical Exam Vitals and nursing note reviewed.  Constitutional:      Appearance: Normal appearance.  HENT:     Head: Normocephalic and atraumatic.     Right Ear: Tympanic membrane and ear canal normal.     Left Ear: Tympanic membrane and ear canal normal.     Nose: Nose normal.     Mouth/Throat:     Mouth: Mucous membranes are moist.     Comments: Swelling noted to the right lower gumline along broken third molar, no areas of fluctuance or induration, no peritonsillar swelling, floor mouth is soft, tolerant secretion without difficulty, no areas of abscess remission  Eyes:     Extraocular Movements: Extraocular movements intact.     Conjunctiva/sclera: Conjunctivae normal.     Pupils:  Pupils are equal, round, and reactive to light.    Cardiovascular:     Rate and Rhythm: Normal rate and regular rhythm.     Pulses: Normal pulses.     Heart sounds: Normal heart sounds.  Pulmonary:     Effort: Pulmonary effort is normal. No respiratory distress.  Abdominal:     General: Bowel sounds are normal.   Musculoskeletal:        General: Normal range of motion.     Cervical back: Normal range of motion and neck supple. No rigidity or tenderness.   Skin:    General: Skin is warm and dry.   Neurological:     General: No focal deficit present.     Mental Status: He is alert and oriented to person, place, and time. Mental status is at baseline.     (all labs ordered are listed,  but only abnormal results are displayed) Labs Reviewed - No data to display  EKG: None  Radiology: No results found.   Procedures   Medications Ordered in the ED  clindamycin  (CLEOCIN ) capsule 300 mg (300 mg Oral Given 09/27/23 1725)                                    Medical Decision Making Patient is doing well at this time and is stable for discharge home.  Discussed with patient that we will cover him for dental infection at this point.  Suspect his otalgia is coming from his dental pain at this time.  He has no clinical indication for mastoiditis, otitis externa, malignant otitis externa, perichondritis.  He has no signs of acute peritonsillar abscess, Ludwig's angina, retropharyngeal abscess, epiglottitis.  He has no signs of acute airway compromise.  Vital signs are stable with no indication for sepsis.  Close follow-up with a dentist was discussed as well as strict turn precautions for any new or worsening symptoms.  Patient voiced understanding and had no additional questions.  Risk Prescription drug management.        Final diagnoses:  Dental infection  Otalgia of both ears    ED Discharge Orders          Ordered    clindamycin  (CLEOCIN ) 300 MG capsule  4 times daily        09/27/23 1725    chlorhexidine  (PERIDEX ) 0.12 % solution  2 times daily        09/27/23 1725               Dicy Smigel D, PA-C 09/27/23 1727    Towana Ozell BROCKS, MD 09/28/23 [phone]

## 2023-09-27 NOTE — ED Triage Notes (Signed)
 Pt reports:  Dental/jaw pain (Bilateral) Ongoing for 1 month Needs tooth removal of wisdom tooth on left side

## 2023-09-28 ENCOUNTER — Telehealth (HOSPITAL_COMMUNITY): Payer: Self-pay | Admitting: Emergency Medicine

## 2023-09-28 MED ORDER — CLINDAMYCIN HCL 300 MG PO CAPS: 300.0000 mg | ORAL_CAPSULE | Freq: Four times a day (QID) | ORAL | 0 refills | Status: AC

## 2023-09-28 MED ORDER — CHLORHEXIDINE GLUCONATE 0.12 % MT SOLN: 15.0000 mL | Freq: Two times a day (BID) | OROMUCOSAL | 0 refills | Status: DC

## 2023-09-28 NOTE — Telephone Encounter (Cosign Needed)
 Prescriptions to be resent to pharmacy.

## 2023-09-28 NOTE — ED Notes (Signed)
 Pt aware prescriptions have been sent and CN verified Pharmacy received them. 1313hrs 09/28/2023
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
## 2023-10-07 ENCOUNTER — Encounter: Payer: Self-pay | Admitting: Cardiology

## 2023-10-07 ENCOUNTER — Ambulatory Visit: Payer: MEDICAID | Attending: Cardiology | Admitting: Cardiology

## 2023-10-07 NOTE — Progress Notes (Deleted)
 Clinical Summary Kyle Mcmillan is a 38 y.o.male  1.Tachycardia - ER visit 07/19/23 with tachycardia - sudden onset of SOB and palpitations. Called EMS, from there eval HR 210. Converted to SR with vagal maneuvers.  - UDS negative. K 3.6, TSH 2.1. No Mg was checked - EKG in ER showed sinus tach 105  EMS runs sheet in media section of epic 07/19/23 shows strip with SVT HR 219.  Past Medical History:  Diagnosis Date   Anxiety    Bronchitis    COPD (chronic obstructive pulmonary disease) (HCC)      Allergies  Allergen Reactions   Ibuprofen  Swelling    Facial swelling   Penicillins     Tolerated cefotetan    Phenobarbital    Tetanus Toxoids      Current Outpatient Medications  Medication Sig Dispense Refill   albuterol  (VENTOLIN  HFA) 108 (90 Base) MCG/ACT inhaler Inhale 2 puffs into the lungs every 6 (six) hours as needed for wheezing or shortness of breath. 8 g 2   benzonatate  (TESSALON  PERLES) 100 MG capsule Take 1-2 tabs TID prn cough 30 capsule 0   busPIRone  (BUSPAR ) 5 MG tablet Take 1 tablet (5 mg total) by mouth 3 (three) times daily. 90 tablet 1   cetirizine  (ZYRTEC ) 10 MG tablet Take 1 tablet (10 mg total) by mouth daily. 30 tablet 0   chlorhexidine  (PERIDEX ) 0.12 % solution Use as directed 15 mLs in the mouth or throat 2 (two) times daily. 120 mL 0   clindamycin  (CLEOCIN ) 300 MG capsule Take 1 capsule (300 mg total) by mouth 4 (four) times daily for 10 days. 40 capsule 0   cyclobenzaprine  (FLEXERIL ) 10 MG tablet Take 1 tablet (10 mg total) by mouth 3 (three) times daily as needed for muscle spasms. 20 tablet 0   fluticasone  (FLONASE ) 50 MCG/ACT nasal spray Place 1 spray into both nostrils 2 (two) times daily. 16 g 2   metoprolol  tartrate (LOPRESSOR ) 25 MG tablet Take 1 tablet (25 mg total) by mouth 2 (two) times daily. 60 tablet 0   mirtazapine  (REMERON ) 15 MG tablet Take 1 tablet (15 mg total) by mouth at bedtime. 30 tablet 1   No current facility-administered  medications for this visit.     Past Surgical History:  Procedure Laterality Date   CHOLECYSTECTOMY     CYSTOSCOPY W/ URETEROSCOPY W/ LITHOTRIPSY       Allergies  Allergen Reactions   Ibuprofen  Swelling    Facial swelling   Penicillins     Tolerated cefotetan    Phenobarbital    Tetanus Toxoids       Family History  Family history unknown: Yes     Social History Kyle Mcmillan reports that he has never smoked. He has never used smokeless tobacco. Kyle Mcmillan reports no history of alcohol use.   Review of Systems CONSTITUTIONAL: No weight loss, fever, chills, weakness or fatigue.  HEENT: Eyes: No visual loss, blurred vision, double vision or yellow sclerae.No hearing loss, sneezing, congestion, runny nose or sore throat.  SKIN: No rash or itching.  CARDIOVASCULAR:  RESPIRATORY: No shortness of breath, cough or sputum.  GASTROINTESTINAL: No anorexia, nausea, vomiting or diarrhea. No abdominal pain or blood.  GENITOURINARY: No burning on urination, no polyuria NEUROLOGICAL: No headache, dizziness, syncope, paralysis, ataxia, numbness or tingling in the extremities. No change in bowel or bladder control.  MUSCULOSKELETAL: No muscle, back pain, joint pain or stiffness.  LYMPHATICS: No enlarged nodes. No history of splenectomy.  PSYCHIATRIC: No history of depression or anxiety.  ENDOCRINOLOGIC: No reports of sweating, cold or heat intolerance. No polyuria or polydipsia.  Kyle Mcmillan   Physical Examination There were no vitals filed for this visit. There were no vitals filed for this visit.  Gen: resting comfortably, no acute distress HEENT: no scleral icterus, pupils equal round and reactive, no palptable cervical adenopathy,  CV Resp: Clear to auscultation bilaterally GI: abdomen is soft, non-tender, non-distended, normal bowel sounds, no hepatosplenomegaly MSK: extremities are warm, no edema.  Skin: warm, no rash Neuro:  no focal deficits Psych: appropriate  affect   Diagnostic Studies     Assessment and Plan        Kyle Mcmillan, M.D., F.A.C.C.

## 2023-10-17 IMAGING — US US ABDOMEN LIMITED
1 series · 14 of 25 positions shown · non-contrast
Comparison: Abdominal CT 07/08/2021.

CLINICAL DATA: Right upper quadrant pain tonight

EXAM:
ULTRASOUND ABDOMEN LIMITED RIGHT UPPER QUADRANT

[Series 1: us abdomen limited ruq (liver/gb) · 14 of 45 slices shown]
[im 1/45]
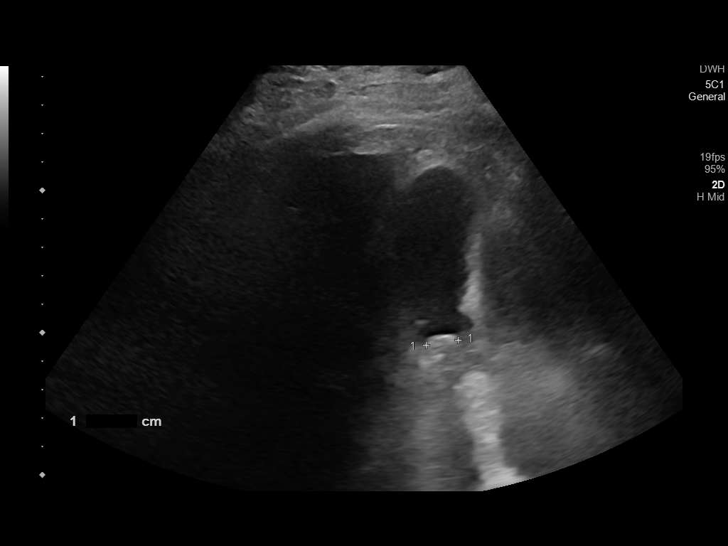
[im 4/45]
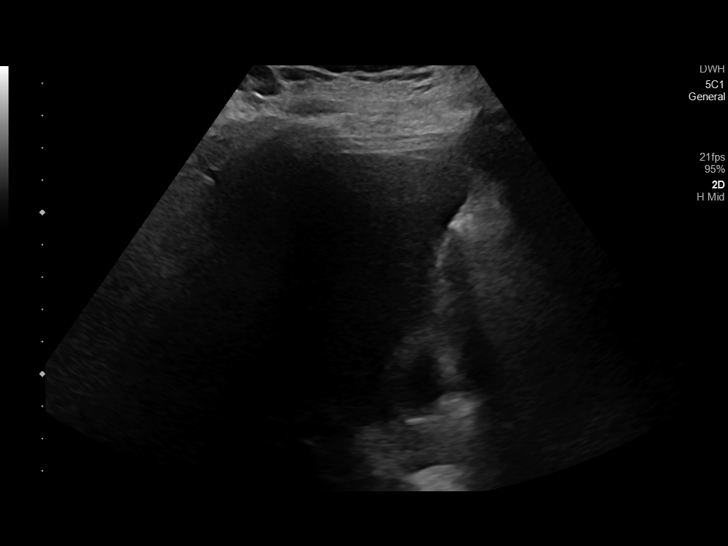
[im 8/45]
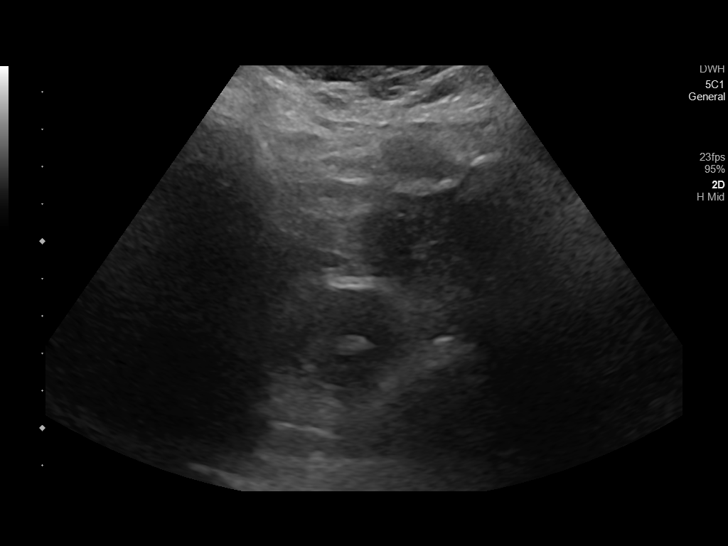
[im 12/45]
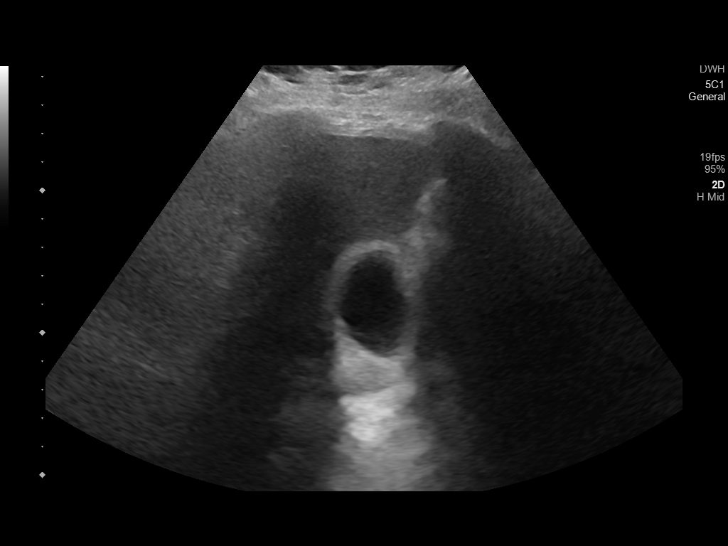
[im 15/45]
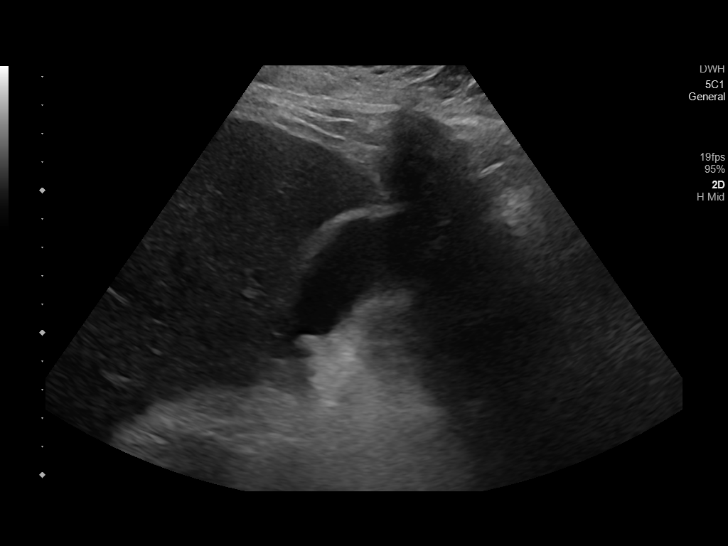
[im 17/45]
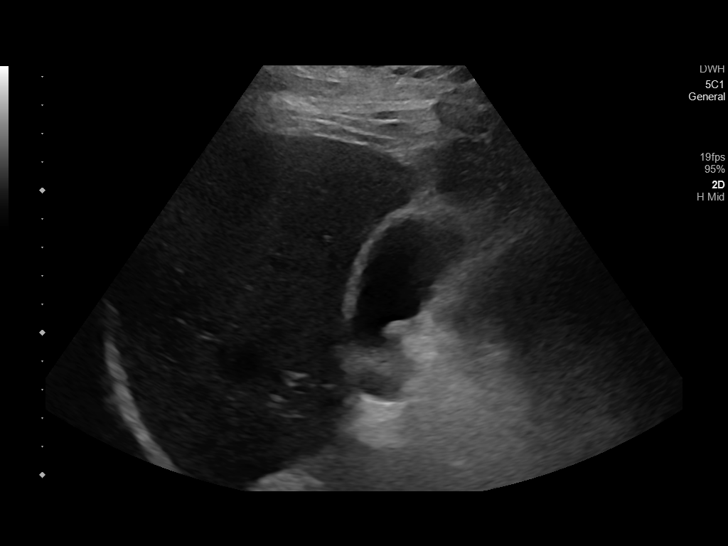
[im 21/45]
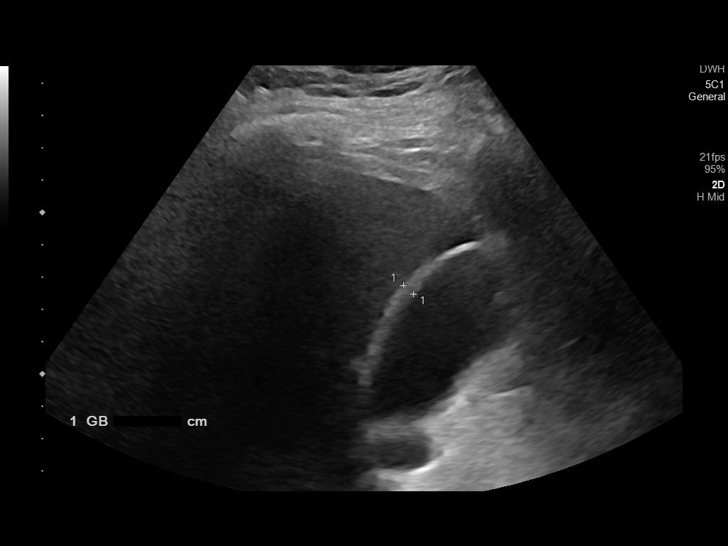
[im 24/45]
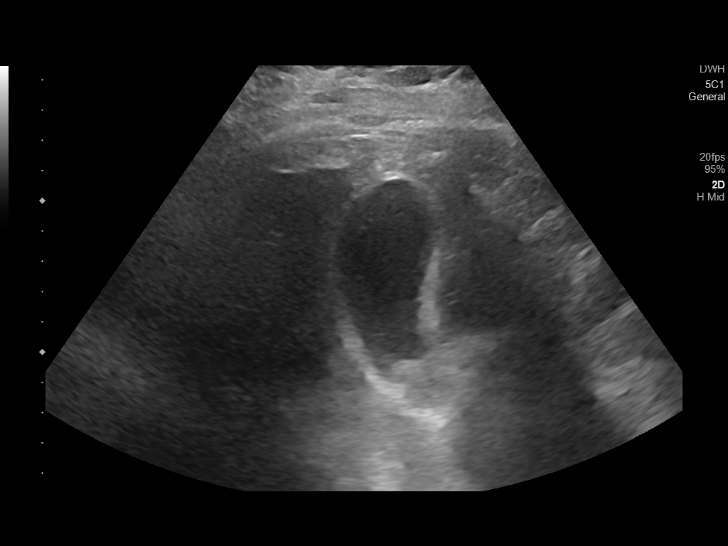
[im 28/45]
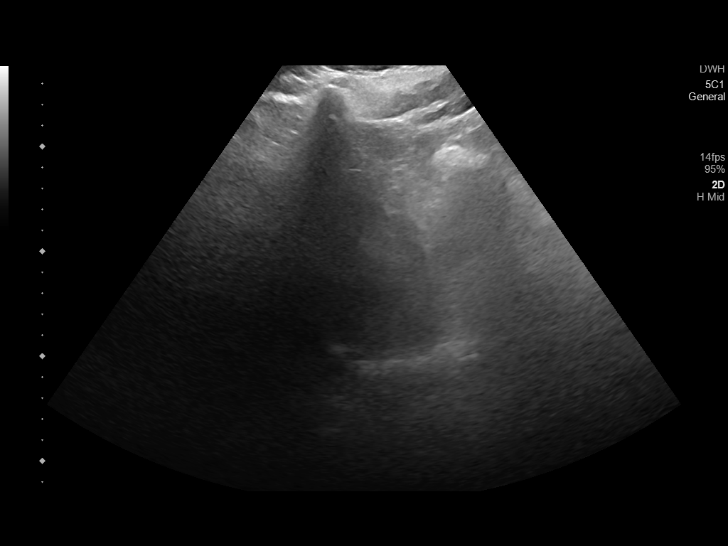
[im 30/45]
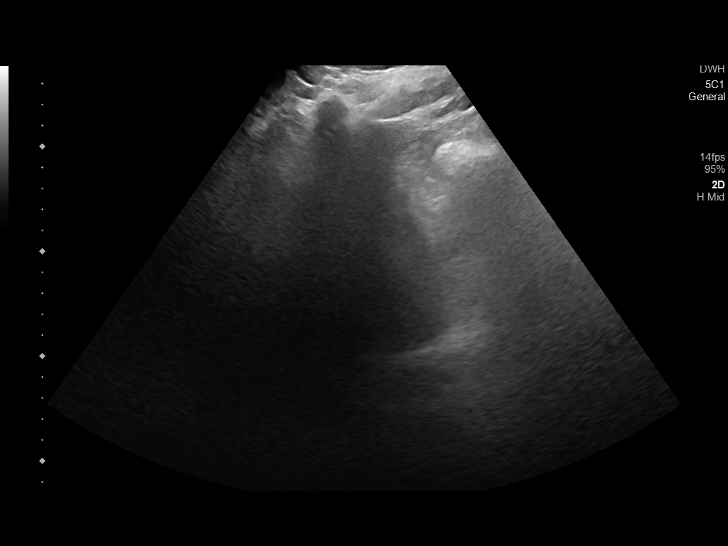
[im 34/45]
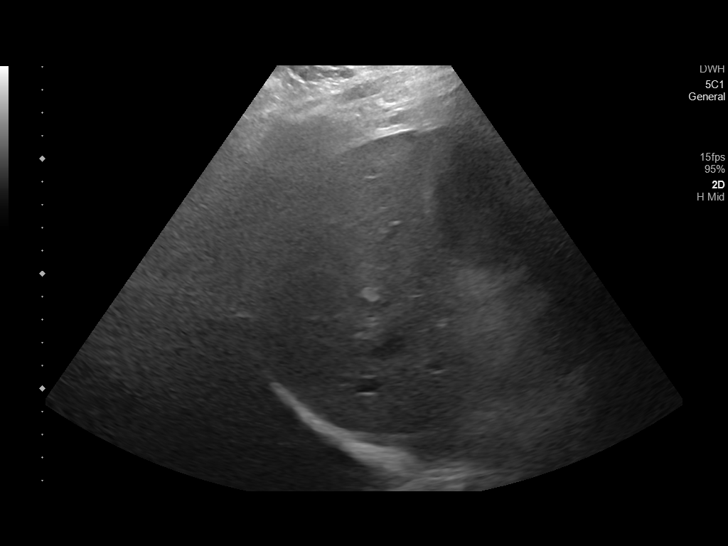
[im 37/45]
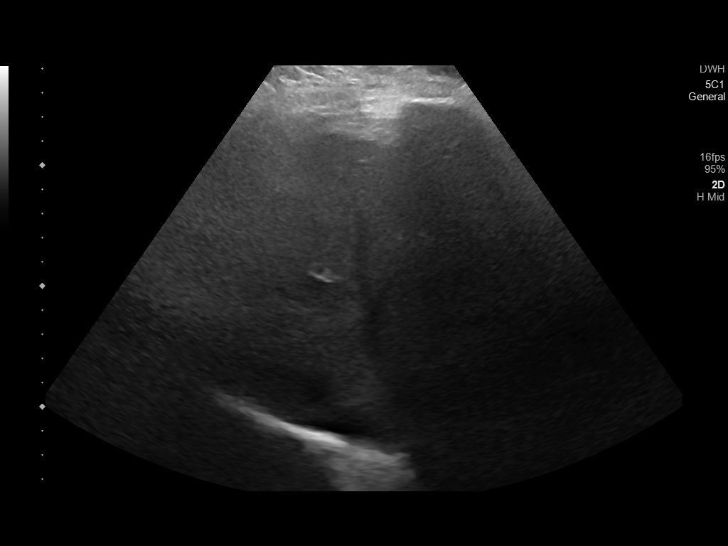
[im 41/45]
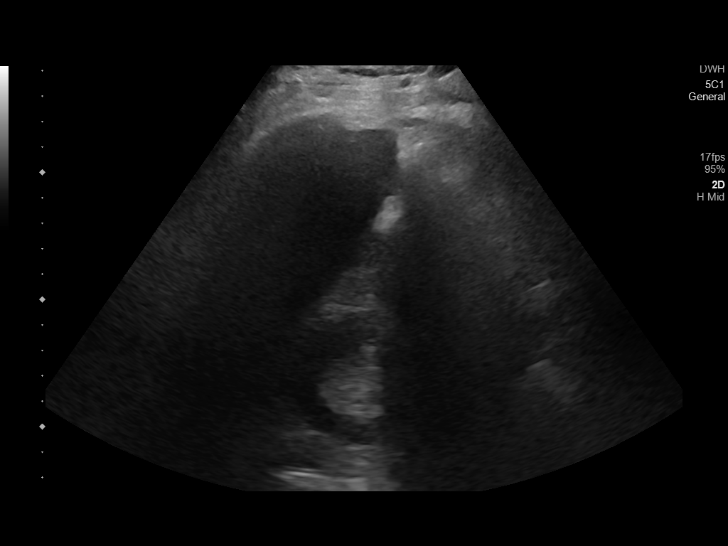
[im 45/45]
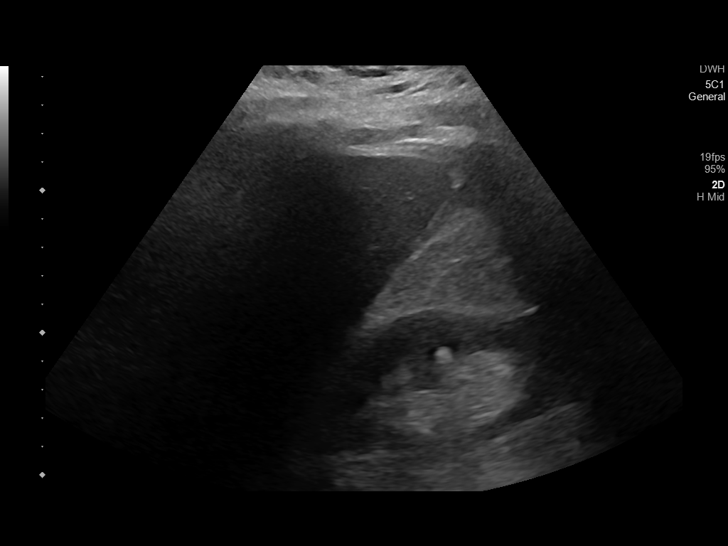

[14 of 25 positions shown; findings below may reference images not displayed]

FINDINGS: Gallbladder:

Cholelithiasis. Gallbladder wall measures up to 4 mm in thickness
but smoothly contoured and without striation. No pericholecystic
edema or focal tenderness.

Common bile duct:

Diameter: 3 mm.

Liver:

No focal lesion identified. Within normal limits in parenchymal
echogenicity. Portal vein is patent on color Doppler imaging with
normal direction of blood flow towards the liver.

Other: Echogenic area measured at the interpolar right kidney does
not correlate with a calculus on recent abdominal CT.
IMPRESSION: Cholelithiasis without findings of acute cholecystitis.

## 2023-10-22 ENCOUNTER — Emergency Department (HOSPITAL_COMMUNITY): Payer: MEDICAID

## 2023-10-22 ENCOUNTER — Emergency Department (HOSPITAL_COMMUNITY)
Admission: EM | Admit: 2023-10-22 | Discharge: 2023-10-22 | Disposition: A | Discharge status: Home / Self Care | Payer: MEDICAID | Attending: Emergency Medicine | Admitting: Emergency Medicine

## 2023-10-22 ENCOUNTER — Encounter (HOSPITAL_COMMUNITY): Payer: Self-pay | Admitting: Emergency Medicine

## 2023-10-22 ENCOUNTER — Other Ambulatory Visit: Payer: Self-pay

## 2023-10-22 DIAGNOSIS — H9313 Tinnitus, bilateral: Secondary | ICD-10-CM | POA: Insufficient documentation

## 2023-10-22 DIAGNOSIS — K047 Periapical abscess without sinus: Secondary | ICD-10-CM | POA: Diagnosis not present

## 2023-10-22 DIAGNOSIS — H6693 Otitis media, unspecified, bilateral: Secondary | ICD-10-CM | POA: Insufficient documentation

## 2023-10-22 DIAGNOSIS — K0889 Other specified disorders of teeth and supporting structures: Secondary | ICD-10-CM | POA: Diagnosis present

## 2023-10-22 DIAGNOSIS — H6690 Otitis media, unspecified, unspecified ear: Secondary | ICD-10-CM

## 2023-10-22 MED ORDER — DOXYCYCLINE HYCLATE 100 MG PO CAPS: 100.0000 mg | ORAL_CAPSULE | Freq: Two times a day (BID) | ORAL | 0 refills | Status: AC

## 2023-10-22 MED ORDER — PREDNISONE 10 MG PO TABS
40.0000 mg | ORAL_TABLET | Freq: Every day | ORAL | 0 refills | Status: AC
Start: 2023-10-22 — End: 2023-10-27

## 2023-10-22 MED ORDER — DOXYCYCLINE HYCLATE 100 MG PO TABS
100.0000 mg | ORAL_TABLET | Freq: Once | ORAL | Status: AC
Start: 2023-10-22 — End: 2023-10-22
  Administered 2023-10-22: 100 mg via ORAL
  Filled 2023-10-22: qty 1

## 2023-10-22 MED ORDER — PREDNISONE 50 MG PO TABS
60.0000 mg | ORAL_TABLET | Freq: Once | ORAL | Status: AC
  Administered 2023-10-22: 60 mg via ORAL
  Filled 2023-10-22: qty 1

## 2023-10-22 NOTE — ED Triage Notes (Addendum)
 Ringing in ears for the past 3 days. Just finished clindamycin  for ear and dental infection. Also states he thinks he may need another abx for teeth because he feels like he still has an infection.

## 2023-10-22 NOTE — ED Provider Notes (Signed)
 [place] EMERGENCY DEPARTMENT AT [facility] Provider Note   CSN: [id number] Arrival date & time: 10/22/23  1342     Patient presents with: Tinnitus   Kyle Mcmillan is a 38 y.o. male.   Patient is a 38 year old male who presents to the emergency department the chief complaint of dental pain, bilateral ear pain and tinnitus.  He notes his symptoms have been ongoing for approximate the past 3 days.  He denies any associated discharge from his ears.  He notes that the tinnitus did start after doing karaoke and notes that he did have a sudden loud sound in his ears during that time.  He denies any abnormal headaches, pain to neck or back.  He denies any numbness, paresthesias or weakness.  He denies any stridor, aphasia, drooling, dyspnea.        Prior to Admission medications   Medication Sig Start Date End Date Taking? Authorizing Provider  doxycycline  (VIBRAMYCIN ) 100 MG capsule Take 1 capsule (100 mg total) by mouth 2 (two) times daily. 10/22/23  Yes Daralene Bruckner D, PA-C  predniSONE  (DELTASONE ) 10 MG tablet Take 4 tablets (40 mg total) by mouth daily for 5 days. Start taking on 10/23/23 10/22/23 10/27/23 Yes Lemmie Vanlanen, Bruckner BIRCH, PA-C  albuterol  (VENTOLIN  HFA) 108 (90 Base) MCG/ACT inhaler Inhale 2 puffs into the lungs every 6 (six) hours as needed for wheezing or shortness of breath. 06/06/22   Menshew, Candida LULLA Kings, PA-C  benzonatate  (TESSALON  PERLES) 100 MG capsule Take 1-2 tabs TID prn cough 06/06/22   Menshew, Candida LULLA Kings, PA-C  busPIRone  (BUSPAR ) 5 MG tablet Take 1 tablet (5 mg total) by mouth 3 (three) times daily. 01/24/20   Clapacs, Norleen DASEN, MD  cetirizine  (ZYRTEC ) 10 MG tablet Take 1 tablet (10 mg total) by mouth daily. 04/14/22   Cuthriell, Dorn BIRCH, PA-C  chlorhexidine  (PERIDEX ) 0.12 % solution Use as directed 15 mLs in the mouth or throat 2 (two) times daily. 09/28/23   Daralene Bruckner BIRCH, PA-C  cyclobenzaprine  (FLEXERIL ) 10 MG tablet Take 1 tablet (10  mg total) by mouth 3 (three) times daily as needed for muscle spasms. 12/28/22   Geroldine Berg, MD  fluticasone  (FLONASE ) 50 MCG/ACT nasal spray Place 1 spray into both nostrils 2 (two) times daily. 01/28/23   Stuart Vernell Norris, PA-C  metoprolol  tartrate (LOPRESSOR ) 25 MG tablet Take 1 tablet (25 mg total) by mouth 2 (two) times daily. 07/19/23 08/18/23  Cleotilde Rogue, MD  mirtazapine  (REMERON ) 15 MG tablet Take 1 tablet (15 mg total) by mouth at bedtime. 01/24/20   Clapacs, Norleen DASEN, MD    Allergies: Ibuprofen , Penicillins, Phenobarbital, and Tetanus toxoids    Review of Systems  HENT:  Positive for dental problem, ear pain and tinnitus.   All other systems reviewed and are negative.   Updated Vital Signs BP (!) 138/90 (BP Location: Right Arm)   Pulse 86   Temp (!) 97.5 F (36.4 C)   Resp 17   Ht 5' 11 (1.803 m)   Wt 127 kg   SpO2 98%   BMI 39.05 kg/m   Physical Exam Vitals and nursing note reviewed.  Constitutional:      Appearance: Normal appearance.  HENT:     Head: Normocephalic and atraumatic.     Ears:     Comments: Bilateral TMs with erythema and bulging, bilateral canals unremarkable, no mastoid tenderness bilaterally    Nose: Nose normal.     Mouth/Throat:  Mouth: Mucous membranes are moist.     Comments: Poor dentition throughout, floor mouth is soft, tolerance crease without difficulty, no peritonsillar swelling, no areas of abscess or nation Eyes:     Extraocular Movements: Extraocular movements intact.     Conjunctiva/sclera: Conjunctivae normal.     Pupils: Pupils are equal, round, and reactive to light.  Cardiovascular:     Rate and Rhythm: Normal rate and regular rhythm.     Pulses: Normal pulses.     Heart sounds: Normal heart sounds.  Pulmonary:     Effort: Pulmonary effort is normal. No respiratory distress.  Musculoskeletal:        General: Normal range of motion.     Cervical back: Normal range of motion and neck supple. No rigidity or  tenderness.  Skin:    General: Skin is warm and dry.  Neurological:     General: No focal deficit present.     Mental Status: He is alert and oriented to person, place, and time. Mental status is at baseline.     Cranial Nerves: No cranial nerve deficit.     Sensory: No sensory deficit.     Motor: No weakness.     Coordination: Coordination normal.     Gait: Gait normal.  Psychiatric:        Mood and Affect: Mood normal.        Behavior: Behavior normal.        Thought Content: Thought content normal.        Judgment: Judgment normal.     (all labs ordered are listed, but only abnormal results are displayed) Labs Reviewed - No data to display  EKG: None  Radiology: CT Head Wo Contrast Result Date: 10/22/2023 CLINICAL DATA:  New onset tinnitus. EXAM: CT HEAD WITHOUT CONTRAST TECHNIQUE: Contiguous axial images were obtained from the base of the skull through the vertex without intravenous contrast. RADIATION DOSE REDUCTION: This exam was performed according to the departmental dose-optimization program which includes automated exposure control, adjustment of the mA and/or kV according to patient size and/or use of iterative reconstruction technique. COMPARISON:  Head CT 01/19/2020 FINDINGS: Brain: There is no evidence of an acute infarct, intracranial hemorrhage, mass, midline shift, or extra-axial fluid collection. Cerebral volume is normal. The ventricles are normal in size. Vascular: No hyperdense vessel. Skull: No fracture or suspicious lesion. Sinuses/Orbits: Small mucous retention cysts in the right sphenoid and left maxillary sinuses. Clear mastoid air cells. Unremarkable included orbits. Other: None. IMPRESSION: Negative head CT. Electronically Signed   By: Dasie Hamburg M.D.   On: 10/22/2023 14:29     Procedures   Medications Ordered in the ED  predniSONE  (DELTASONE ) tablet 60 mg (has no administration in time range)  doxycycline  (VIBRA -TABS) tablet 100 mg (has no  administration in time range)                                    Medical Decision Making Patient is doing well at this time and is stable for discharge home.  Discussed with patient we will cover him for dental infection and otitis media at this time.  CT scan of the head was otherwise unremarkable with no signs of space-occupying lesions.  He has no other concerning neurological deficits at this point.  Do suspect his tinnitus is secondary to the recent exposure to loud noise.  Do not suspect any further emergent workup is warranted  at this time.  He has no indication for an obvious drainable abscess.  There is no signs of acute peritonsillar abscess, Ludwig's angina, retropharyngeal abscess, epiglottitis.  Close follow-up with PCP was discussed as well as strict turn precautions for any new or worsening symptoms.  Patient voiced understanding and had no additional questions.  Amount and/or Complexity of Data Reviewed Radiology: ordered.  Risk Prescription drug management.        Final diagnoses:  Dental infection  Acute otitis media, unspecified otitis media type  Tinnitus of both ears    ED Discharge Orders          Ordered    doxycycline  (VIBRAMYCIN ) 100 MG capsule  2 times daily        10/22/23 1501    predniSONE  (DELTASONE ) 10 MG tablet  Daily        10/22/23 1501               Daralene Lonni JONETTA DEVONNA 10/22/23 1504    Suzette Pac, MD 10/26/23 1238

## 2023-10-22 NOTE — Discharge Instructions (Signed)
 Please follow-up closely with your primary care doctor on an outpatient basis.  Return to emergency department immediately for any new or worsening symptoms.  Take all medications as directed.

## 2023-10-24 ENCOUNTER — Emergency Department
Admission: EM | Admit: 2023-10-24 | Discharge: 2023-10-24 | Disposition: A | Discharge status: Home / Self Care | Payer: MEDICAID

## 2023-10-24 ENCOUNTER — Other Ambulatory Visit: Payer: Self-pay

## 2023-10-24 ENCOUNTER — Encounter: Payer: Self-pay | Admitting: Emergency Medicine

## 2023-10-24 DIAGNOSIS — H6506 Acute serous otitis media, recurrent, bilateral: Secondary | ICD-10-CM | POA: Diagnosis not present

## 2023-10-24 DIAGNOSIS — I10 Essential (primary) hypertension: Secondary | ICD-10-CM | POA: Insufficient documentation

## 2023-10-24 DIAGNOSIS — M2669 Other specified disorders of temporomandibular joint: Secondary | ICD-10-CM | POA: Insufficient documentation

## 2023-10-24 DIAGNOSIS — J341 Cyst and mucocele of nose and nasal sinus: Secondary | ICD-10-CM | POA: Insufficient documentation

## 2023-10-24 DIAGNOSIS — R29898 Other symptoms and signs involving the musculoskeletal system: Secondary | ICD-10-CM

## 2023-10-24 DIAGNOSIS — H6503 Acute serous otitis media, bilateral: Secondary | ICD-10-CM

## 2023-10-24 DIAGNOSIS — H9311 Tinnitus, right ear: Secondary | ICD-10-CM | POA: Insufficient documentation

## 2023-10-24 DIAGNOSIS — H9201 Otalgia, right ear: Secondary | ICD-10-CM | POA: Diagnosis present

## 2023-10-24 NOTE — ED Triage Notes (Signed)
 Pt in via POV, reports ongoing ringing in right ear x 5 days.  Denies any other complaints.  Reports being seen at [facility] but not feeling as if he was assessed properly, denies receiving ENT referral.  Ambulatory to triage; NAD noted at this time.

## 2023-10-24 NOTE — Discharge Instructions (Addendum)
 You have been diagnosed with tinnitus of right ear, bilateral acute serous otitis media, TMJ click, maxillary sinus cyst, cyst of right sphenoid sinus.  Please continue taking doxycycline  and prednisone  to complete the treatment for your otitis.  You can take melatonin to help you at night.  Please drink plenty fluids.  Please call Dr. Rumalda to make an appointment for a follow-up of your ears and sinus cyst.  Please call to the dentist and make an appointment for follow-up of your TMJ or temporomandibular joint.  Come back to ED or go to your PCP if you have new symptoms or symptoms worsen. OPTIONS FOR DENTAL FOLLOW UP CARE  [place] [facility] - Local [facility] Dental Clinics [email or web address]   [facility] [phone])  Norita Goldberg [phone])  [place] [phone])  [facility] [phone])  [facility] [phone]) This clinic caters to the indigent population and is on a lottery system. Location: [employer] of Dentistry, [employer], [address] [place] Clinic Hours: Wednesdays from 6pm - 9pm, patients seen by a lottery system. For dates, call or go to [email or web address] Services: Cleanings, fillings and simple extractions. Payment Options: DENTAL WORK IS FREE OF CHARGE. Bring proof of income or support. Best way to get seen: Arrive at 5:15 pm - this is a lottery, NOT first come/first serve, so arriving earlier will not increase your chances of being seen.     [facility] [phone] Select option 1 for emergencies   Location: [facility] of Dentistry, [place], [address], [place] Clinic Hours: No walk-ins accepted - call the day before to schedule an appointment. Check in times are 9:30 am and 1:30 pm. Services: Simple extractions, temporary  fillings, pulpectomy/pulp debridement, uncomplicated abscess drainage. Payment Options: PAYMENT IS DUE AT THE TIME OF SERVICE.  Fee is usually $100-200, additional surgical procedures (e.g. abscess drainage) may be extra. Cash, checks, Visa/MasterCard accepted.  Can file Medicaid if patient is covered for dental - patient should call case worker to check. No discount for [facility] patients. Best way to get seen: MUST call the day before and get onto the schedule. Can usually be seen the next 1-2 days. No walk-ins accepted.     [facility] [phone]   Location: [facility], [address], [place] Clinic Hours: M, W, Th, F 8am or 1:30pm, Tues 9a or 1:30 - first come/first served. Services: Simple extractions, temporary fillings, uncomplicated abscess drainage.  You do not need to be an [facility] resident. Payment Options: PAYMENT IS DUE AT THE TIME OF SERVICE. Dental insurance, otherwise sliding scale - bring proof of income or support. Depending on income and treatment needed, cost is usually $50-200. Best way to get seen: Arrive early as it is first come/first served.     [facility] [phone]   Location: [address] Clinic Hours: Mon-Thu 8a-5p Services: Most basic dental services including extractions and fillings. Payment Options: PAYMENT IS DUE AT THE TIME OF SERVICE. Sliding scale, up to 50% off - bring proof if income or support. Medicaid with dental option accepted. Best way to get seen: Call to schedule an appointment, can usually be seen within 2 weeks OR they will try to see walk-ins - show up at 8a or 2p (you may have to wait).     [facility] [phone] [place] RESIDENTS ONLY  Location: [employer], [address] [place], [place] [address] Clinic Hours: By appointment only. Monday - Thursday 8am-5pm, Friday  8am-12pm Services: Cleanings, fillings, extractions. Payment Options: PAYMENT IS DUE AT THE TIME OF SERVICE. Cash, Visa or MasterCard. Sliding scale - $30 minimum per service. Best way to get seen: Come in to office, complete packet and make an appointment - need proof of income or support monies for each household member and proof of [facility] residence. Usually takes about a month to get in.     [facility] [phone]   Location: [address], [facility] Clinic Hours: Walk-in Urgent Care Dental Services are offered Monday-Friday mornings only. The numbers of emergencies accepted daily is limited to the number of providers available. Maximum 15 - Mondays, Wednesdays & Thursdays Maximum 10 - Tuesdays & Fridays Services: You do not need to be a [facility] resident to be seen for a dental emergency. Emergencies are defined as pain, swelling, abnormal bleeding, or dental trauma. Walkins will receive x-rays if needed. NOTE: Dental cleaning is not an emergency. Payment Options: PAYMENT IS DUE AT THE TIME OF SERVICE. Minimum co-pay is $40.00 for uninsured patients. Minimum co-pay is $3.00 for Medicaid with dental coverage. Dental Insurance is accepted and must be presented at time of visit. Medicare does not cover dental. Forms of payment: Cash, credit card, checks. Best way to get seen: If not previously registered with the clinic, walk-in dental registration begins at 7:15 am and is on a first come/first serve basis. If previously registered with the clinic, call to make an appointment.     [facility] [phone] [place] RESIDENTS ONLY   Location: [address], [place], [place] Clinic Hours: Mon-Thu 10a-2p Services: Extractions only! Payment Options: FREE (donations accepted) - bring proof of income or support Best way to get seen: Call and schedule an appointment OR come at 8am on the 1st Monday of every month  (except for holidays) when it is first come/first served.     [facility] [phone]   Location: [address] [place], [place] Clinic Hours: Friday mornings Services, Payment Options, Best way to get seen: Call for info
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
## 2023-10-29 ENCOUNTER — Ambulatory Visit: Payer: MEDICAID | Attending: Cardiology | Admitting: Cardiovascular Disease

## 2023-10-29 ENCOUNTER — Encounter: Payer: Self-pay | Admitting: Cardiovascular Disease

## 2023-10-29 VITALS — BP 112/74 | HR 96 | Ht 70.0 in | Wt 290.4 lb

## 2023-10-29 DIAGNOSIS — G4710 Hypersomnia, unspecified: Secondary | ICD-10-CM | POA: Diagnosis present

## 2023-10-29 DIAGNOSIS — R0683 Snoring: Secondary | ICD-10-CM | POA: Diagnosis present

## 2023-10-29 DIAGNOSIS — I471 Supraventricular tachycardia, unspecified: Secondary | ICD-10-CM | POA: Insufficient documentation

## 2023-10-29 MED ORDER — METOPROLOL TARTRATE 25 MG PO TABS: 25.0000 mg | ORAL_TABLET | Freq: Two times a day (BID) | ORAL | 3 refills | Status: AC

## 2023-10-29 NOTE — Progress Notes (Signed)
 Medical Clearance for Dental Procedure was faxed to [facility]  Fax # [phone]

## 2023-10-29 NOTE — Patient Instructions (Signed)
 Medication Instructions:  No changes *If you need a refill on your cardiac medications before your next appointment, please call your pharmacy*  Lab Work: None ordered If you have labs (blood work) drawn today and your tests are completely normal, you will receive your results only by: MyChart Message (if you have MyChart) OR A paper copy in the mail If you have any lab test that is abnormal or we need to change your treatment, we will call you to review the results.  Testing/Procedures: WatchPAT?  Is a FDA cleared portable home sleep study test that uses a watch and 3 points of contact to monitor 7 different channels, including your heart rate, oxygen saturations, body position, snoring, and chest motion.  The study is easy to use from the comfort of your own home and accurately detect sleep apnea.  Before bed, you attach the chest sensor, attached the sleep apnea bracelet to your nondominant hand, and attach the finger probe.  After the study, the raw data is downloaded from the watch and scored for apnea events.   For more information: [email or web address]  Patient Testing Instructions:  Do not put battery into the device until bedtime when you are ready to begin the test. Please call the support number if you need assistance after following the instructions below: 24 hour support line- [phone] or [facility] support at [phone] (option 2)  Download the [facility] WatchPAT One app through the google play store or App Store  Be sure to turn on or enable access to bluetooth in settlings on your smartphone/ device  Make sure no other bluetooth devices are on and within the vicinity of your smartphone/ device and WatchPAT watch during testing.  Make sure to leave your smart phone/ device plugged in and charging all night.  When ready for bed:  Follow the instructions step by step in the WatchPAT One App to activate the testing device. For additional instructions,  including video instruction, visit the WatchPAT One video on Youtube. You can search for WatchPat One within Youtube (video is 4 minutes and 18 seconds) or enter: https://youtube/watch?[email or web address]_vbiwxE Please note: You will be prompted to enter a Pin to connect via bluetooth when starting the test. The PIN will be assigned to you when you receive the test.  The device is disposable, but it recommended that you retain the device until you receive a call letting you know the study has been received and the results have been interpreted.  We will let you know if the study did not transmit to us  properly after the test is completed. You do not need to call us  to confirm the receipt of the test.  Please complete the test within 48 hours of receiving PIN.   Frequently Asked Questions:  What is Watch Bruna one?  A single use fully disposable home sleep apnea testing device and will not need to be returned after completion.  What are the requirements to use WatchPAT one?  The be able to have a successful watchpat one sleep study, you should have your Watch pat one device, your smart phone, watch pat one app, your PIN number and Internet access What type of phone do I need?  You should have a smart phone that uses Android 5.1 and above or any Iphone with IOS 10 and above How can I download the WatchPAT one app?  Based on your device type search for WatchPAT one app either in google play for android devices or APP store for Iphone's Where will  I get my PIN for the study?  Your PIN will be provided by your physician's office. It is used for authentication and if you lose/forget your PIN, please reach out to your providers office.  I do not have Internet at home. Can I do WatchPAT one study?  WatchPAT One needs Internet connection throughout the night to be able to transmit the sleep data. You can use your home/local internet or your cellular's data package. However, it is always recommended to use home/local  Internet. It is estimated that between 20MB-30MB will be used with each study.However, the application will be looking for space in the phone to start the study.  What happens if I lose internet or bluetooth connection?  During the internet disconnection, your phone will not be able to transmit the sleep data. All the data, will be stored in your phone. As soon as the internet connection is back on, the phone will being sending the sleep data. During the bluetooth disconnection, WatchPAT one will not be able to to send the sleep data to your phone. Data will be kept in the WatchPAT one until two devices have bluetooth connection back on. As soon as the connection is back on, WatchPAT one will send the sleep data to the phone.  How long do I need to wear the WatchPAT one?  After you start the study, you should wear the device at least 6 hours.  How far should I keep my phone from the device?  During the night, your phone should be within 15 feet.  What happens if I leave the room for restroom or other reasons?  Leaving the room for any reason will not cause any problem. As soon as your get back to the room, both devices will reconnect and will continue to send the sleep data. Can I use my phone during the sleep study?  Yes, you can use your phone as usual during the study. But it is recommended to put your watchpat one on when you are ready to go to bed.  How will I get my study results?  A soon as you completed your study, your sleep data will be sent to the provider. They will then share the results with you when they are ready.    Follow-Up: At [facility], you and your health needs are our priority.  As part of our continuing mission to provide you with exceptional heart care, our providers are all part of one team.  This team includes your primary Cardiologist (physician) and Advanced Practice Providers or APPs (Physician Assistants and Nurse Practitioners) who all work together to  provide you with the care you need, when you need it.  Your next appointment:   1 year(s)  Provider:   Dr Francyne   We recommend signing up for the patient portal called MyChart.  Sign up information is provided on this After Visit Summary.  MyChart is used to connect with patients for Virtual Visits (Telemedicine).  Patients are able to view lab/test results, encounter notes, upcoming appointments, etc.  Non-urgent messages can be sent to your provider as well.   To learn more about what you can do with MyChart, go to [email or web address].

## 2023-11-01 NOTE — Progress Notes (Signed)
 Cardiology Office Note:    Date:  11/01/2023   ID:  [name], [name] 04-07-1985, MRN [id number]  PCP:  [name], MD   [facility] Providers Cardiologist:  None     Referring MD: [name], MD   Chief Complaint  Patient presents with   Advice Only  Kyle Mcmillan is a 38 y.o. male who is being seen today for the evaluation of SVT at the request of [name], MD.   History of Present Illness:    Kyle Mcmillan is a 38 y.o. male with a hx of anxiety, smoking-related lung disease, morbid obesity, who has had repeated episodes of rapid palpitations due to SVT.  She requires extensive dental procedures in this year for clearance.  He was initially referred to cardiology in April, after he was seen in the emergency room with rapid palpitations.  They occurred spontaneously while sitting on the couch and were associated with significant worsening of his baseline shortness of breath.  EMS found him to be in SVT with a rate over 200 bpm and managed to abort the arrhythmia by teaching him vagal maneuvers.    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  The ECG performed in sinus rhythm does not show evidence of preexcitation.    Since then he has had other brief episodes of palpitations that he generally been able to abort performing the Valsalva maneuver.  He was prescribed metoprolol  but ran out of the med and the palpitations have since become more of a bother.  He has not experienced syncope.  He has gained almost 100 pounds in the last year.  He describes constant daytime fatigue and shortness of breath, poor sleep, hypersomnolence.  His partner reports that he snores loudly and she is often witnessed apneic events.  His BMI is over 41.  He has not had chest pain or a recent change in his pattern of chronic dyspnea, NYHA functional class II.  He is taking prednisone  for bilateral acute serous otitis media and is on  antibiotics.  Past Medical History:  Diagnosis Date   Anxiety    Bronchitis    COPD (chronic obstructive pulmonary disease) (HCC)     Past Surgical History:  Procedure Laterality Date   CHOLECYSTECTOMY     CYSTOSCOPY W/ URETEROSCOPY W/ LITHOTRIPSY      Current Medications: Current Meds  Medication Sig   chlorhexidine  (PERIDEX ) 0.12 % solution Use as directed 15 mLs in the mouth or throat 2 (two) times daily.   doxycycline  (VIBRAMYCIN ) 100 MG capsule Take 1 capsule (100 mg total) by mouth 2 (two) times daily.   fluticasone  (FLONASE ) 50 MCG/ACT nasal spray Place 1 spray into both nostrils 2 (two) times daily.     Allergies:   Ibuprofen , Phenobarbital, Tetanus toxoids, and Penicillins   Social History   Socioeconomic History   Marital status: Significant Other    Spouse name: Not on file   Number of children: Not on file   Years of education: Not on file   Highest education level: Not on file  Occupational History   Not on file  Tobacco Use   Smoking status: Never   Smokeless tobacco: Never  Vaping Use   Vaping status: Never Used  Substance and Sexual Activity   Alcohol use: No   Drug use: Never   Sexual activity: Not on file  Other Topics Concern   Not on file  Social History Narrative  Not on file   Social Drivers of Health   Financial Resource Strain: Not on file  Food Insecurity: Not on file  Transportation Needs: Not on file  Physical Activity: Not on file  Stress: Not on file  Social Connections: Not on file     Family History: The patient's Family history is unknown by patient.  ROS:   Please see the history of present illness.     All other systems reviewed and are negative.  EKGs/Labs/Other Studies Reviewed:    The following studies were reviewed today:  EKG Interpretation Date/Time:  Wednesday October 29 2023 15:56:50 EDT Ventricular Rate:  96 PR Interval:  140 QRS Duration:  90 QT Interval:  338 QTC Calculation: 427 R  Axis:   64  Text Interpretation: Normal sinus rhythm Normal ECG When compared with ECG of 19-Jul-2023 16:17, PREVIOUS ECG IS PRESENT Confirmed by Siennah Barrasso ([id number]) on 10/29/2023 4:17:25 PM    Recent Labs: 07/19/2023: ALT 24; BUN 18; Creatinine, Ser 1.32; Hemoglobin 15.3; Platelets 269; Potassium 3.6; Sodium 136; TSH 2.141  Recent Lipid Panel No results found for: CHOL, TRIG, HDL, CHOLHDL, VLDL, LDLCALC, LDLDIRECT   Risk Assessment/Calculations:        STOP-Bang Score:  6       Physical Exam:    VS:  BP 112/74   Pulse 96   Ht 5' 10 (1.778 m)   Wt 290 lb 6.4 oz (131.7 kg)   SpO2 96%   BMI 41.67 kg/m     Wt Readings from Last 3 Encounters:  10/29/23 290 lb 6.4 oz (131.7 kg)  10/24/23 280 lb (127 kg)  10/22/23 280 lb (127 kg)     GEN: Morbidly obese, well nourished, well developed in no acute distress HEENT: Normal except poor dentition NECK: No JVD; No carotid bruits LYMPHATICS: No lymphadenopathy CARDIAC: RRR, no murmurs, rubs, gallops RESPIRATORY:  Clear to auscultation without rales, wheezing or rhonchi  ABDOMEN: Soft, non-tender, non-distended MUSCULOSKELETAL:  No edema; No deformity  SKIN: Warm and dry.  Multiple tattoos NEUROLOGIC:  Alert and oriented x 3 PSYCHIATRIC:  Normal affect   ASSESSMENT:    1. SVT (supraventricular tachycardia) (HCC)   2. Snoring   3. 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  Aware of how to perform Valsalva maneuver, also instructed him in the diving reflex.  Will refill his metoprolol .  Talked about the risk of beta-blocker rebound if he simply allows himself to run out of the medication.  At this point ablation does not appear to be indicated since the episodes are fairly easy to interrupt and are not associated with severe symptoms. Hypersomnolence: Very high level of suspicion for obstructive sleep apnea.  We  talked about the fact that this will precipitate more frequent episodes of SVT at manage at the long run it can cause severe cardiac and pulmonary complications.  He is terrified of going inside for any sleep study, but is okay with performing a home sleep study.  Strongly recommend intense efforts at weight loss.  At some point may benefit from an echocardiogram, but currently without clear evidence of structural cardiac abnormalities.           Medication Adjustments/Labs and Tests Ordered: Current medicines are reviewed at length with the patient today.  Concerns regarding medicines are outlined above.  Orders Placed This Encounter  Procedures   EKG 12-Lead  [facility] Sleep Study   Meds ordered this encounter  Medications   metoprolol  tartrate (LOPRESSOR ) 25 MG tablet    Sig: Take 1 tablet (25 mg total) by mouth 2 (two) times daily.    Dispense:  180 tablet    Refill:  3    Patient Instructions  Medication Instructions:  No changes *If you need a refill on your cardiac medications before your next appointment, please call your pharmacy*  Lab Work: None ordered If you have labs (blood work) drawn today and your tests are completely normal, you will receive your results only by: MyChart Message (if you have MyChart) OR A paper copy in the mail If you have any lab test that is abnormal or we need to change your treatment, we will call you to review the results.  Testing/Procedures: WatchPAT?  Is a FDA cleared portable home sleep study test that uses a watch and 3 points of contact to monitor 7 different channels, including your heart rate, oxygen saturations, body position, snoring, and chest motion.  The study is easy to use from the comfort of your own home and accurately detect sleep apnea.  Before bed, you attach the chest sensor, attached the sleep apnea bracelet to your nondominant hand, and attach the finger probe.  After the study, the raw data is downloaded from the watch  and scored for apnea events.   For more information: [email or web address]  Patient Testing Instructions:  Do not put battery into the device until bedtime when you are ready to begin the test. Please call the support number if you need assistance after following the instructions below: 24 hour support line- [phone] or [facility] support at [phone] (option 2)  Download the [facility] WatchPAT One app through the google play store or App Store  Be sure to turn on or enable access to bluetooth in settlings on your smartphone/ device  Make sure no other bluetooth devices are on and within the vicinity of your smartphone/ device and WatchPAT watch during testing.  Make sure to leave your smart phone/ device plugged in and charging all night.  When ready for bed:  Follow the instructions step by step in the WatchPAT One App to activate the testing device. For additional instructions, including video instruction, visit the WatchPAT One video on Youtube. You can search for WatchPat One within Youtube (video is 4 minutes and 18 seconds) or enter: https://youtube/watch?[email or web address]_vbiwxE Please note: You will be prompted to enter a Pin to connect via bluetooth when starting the test. The PIN will be assigned to you when you receive the test.  The device is disposable, but it recommended that you retain the device until you receive a call letting you know the study has been received and the results have been interpreted.  We will let you know if the study did not transmit to us  properly after the test is completed. You do not need to call us  to confirm the receipt of the test.  Please complete the test within 48 hours of receiving PIN.   Frequently Asked Questions:  What is Watch Bruna one?  A single use fully disposable home sleep apnea testing device and will not need to be returned after completion.  What are the requirements to use WatchPAT one?  The be able to have a successful  watchpat one sleep study, you should have your Watch pat one device, your smart phone, watch pat one app, your PIN number and Internet access What type of phone do I  need?  You should have a smart phone that uses Android 5.1 and above or any Iphone with IOS 10 and above How can I download the WatchPAT one app?  Based on your device type search for WatchPAT one app either in google play for android devices or APP store for Iphone's Where will I get my PIN for the study?  Your PIN will be provided by your physician's office. It is used for authentication and if you lose/forget your PIN, please reach out to your providers office.  I do not have Internet at home. Can I do WatchPAT one study?  WatchPAT One needs Internet connection throughout the night to be able to transmit the sleep data. You can use your home/local internet or your cellular's data package. However, it is always recommended to use home/local Internet. It is estimated that between 20MB-30MB will be used with each study.However, the application will be looking for space in the phone to start the study.  What happens if I lose internet or bluetooth connection?  During the internet disconnection, your phone will not be able to transmit the sleep data. All the data, will be stored in your phone. As soon as the internet connection is back on, the phone will being sending the sleep data. During the bluetooth disconnection, WatchPAT one will not be able to to send the sleep data to your phone. Data will be kept in the WatchPAT one until two devices have bluetooth connection back on. As soon as the connection is back on, WatchPAT one will send the sleep data to the phone.  How long do I need to wear the WatchPAT one?  After you start the study, you should wear the device at least 6 hours.  How far should I keep my phone from the device?  During the night, your phone should be within 15 feet.  What happens if I leave the room for restroom or  other reasons?  Leaving the room for any reason will not cause any problem. As soon as your get back to the room, both devices will reconnect and will continue to send the sleep data. Can I use my phone during the sleep study?  Yes, you can use your phone as usual during the study. But it is recommended to put your watchpat one on when you are ready to go to bed.  How will I get my study results?  A soon as you completed your study, your sleep data will be sent to the provider. They will then share the results with you when they are ready.    Follow-Up: At [facility], you and your health needs are our priority.  As part of our continuing mission to provide you with exceptional heart care, our providers are all part of one team.  This team includes your primary Cardiologist (physician) and Advanced Practice Providers or APPs (Physician Assistants and Nurse Practitioners) who all work together to provide you with the care you need, when you need it.  Your next appointment:   1 year(s)  Provider:   Dr Francyne   We recommend signing up for the patient portal called MyChart.  Sign up information is provided on this After Visit Summary.  MyChart is used to connect with patients for Virtual Visits (Telemedicine).  Patients are able to view lab/test results, encounter notes, upcoming appointments, etc.  Non-urgent messages can be sent to your provider as well.   To learn more about what you can do  with MyChart, go to [email or web address].          Signed, Jerel Balding, MD  11/01/2023 12:14 PM    [facility]

## 2023-11-03 ENCOUNTER — Emergency Department
Admission: EM | Admit: 2023-11-03 | Discharge: 2023-11-03 | Disposition: A | Discharge status: Home / Self Care | Payer: MEDICAID

## 2023-11-03 ENCOUNTER — Encounter: Payer: Self-pay | Admitting: Emergency Medicine

## 2023-11-03 ENCOUNTER — Other Ambulatory Visit: Payer: Self-pay

## 2023-11-03 DIAGNOSIS — H9203 Otalgia, bilateral: Secondary | ICD-10-CM | POA: Diagnosis present

## 2023-11-03 DIAGNOSIS — J449 Chronic obstructive pulmonary disease, unspecified: Secondary | ICD-10-CM | POA: Insufficient documentation

## 2023-11-03 DIAGNOSIS — J019 Acute sinusitis, unspecified: Secondary | ICD-10-CM | POA: Insufficient documentation

## 2023-11-03 DIAGNOSIS — K0889 Other specified disorders of teeth and supporting structures: Secondary | ICD-10-CM | POA: Insufficient documentation

## 2023-11-03 MED ORDER — CHLORHEXIDINE GLUCONATE 0.12 % MT SOLN: 15.0000 mL | Freq: Two times a day (BID) | OROMUCOSAL | 0 refills | Status: AC

## 2023-11-03 MED ORDER — CEFDINIR 300 MG PO CAPS: 300.0000 mg | ORAL_CAPSULE | Freq: Two times a day (BID) | ORAL | 0 refills | Status: AC

## 2023-11-03 MED ORDER — OXYMETAZOLINE HCL 0.05 % NA SOLN: 2.0000 | Freq: Two times a day (BID) | NASAL | 2 refills | Status: AC

## 2023-11-03 MED ORDER — OXYCODONE-ACETAMINOPHEN 5-325 MG PO TABS: 1.0000 | ORAL_TABLET | ORAL | 0 refills | Status: DC | PRN

## 2023-11-03 NOTE — ED Provider Notes (Signed)
 [facility] Provider Note    Event Date/Time   First MD Initiated Contact with Patient 11/03/23 2133     (approximate)   History   Otalgia   HPI  Kyle RODRIQUEZ is a 38 y.o. male  with COPD, anxiety/depression, SVT who returns to the emergency department with multiple complaints including tinnitus, bilateral ear pain, nasal congestion, right dental pain and intractable headache.  Patient presents with his significant other who helps contribute to the history. Patient has been seen at our facility 3 previous times over the past month for dental infections and sinusitis.  He has an allergy to penicillin and has been on a course of clindamycin  and doxycycline .  His last dose of doxycycline  was approximately 1 week ago.  He tells me that he has seen an ENT specialist (unfortunately I do not have access to this documentation), a dentist without any resolve of his symptoms.  He denies any current substance use.  He is very frustrated with his course of care thus far and the fact that he does not have answers.  He does tell me that he had a mandibular molar removed 1 week ago.  He denies any SI HI or AVHS.    Physical Exam   Triage Vital Signs: ED Triage Vitals  Encounter Vitals Group     BP 11/03/23 1937 (!) 143/93     Girls Systolic BP Percentile --      Girls Diastolic BP Percentile --      Boys Systolic BP Percentile --      Boys Diastolic BP Percentile --      Pulse Rate 11/03/23 1937 78     Resp 11/03/23 1937 18     Temp 11/03/23 1937 98 F (36.7 C)     Temp src --      SpO2 11/03/23 1937 97 %     Weight 11/03/23 1936 290 lb (131.5 kg)     Height 11/03/23 1936 5' 10 (1.778 m)     Head Circumference --      Peak Flow --      Pain Score 11/03/23 1936 8     Pain Loc --      Pain Education --      Exclude from Growth Chart --     Most recent vital signs: Vitals:   11/03/23 1937  BP: (!) 143/93  Pulse: 78  Resp: 18  Temp: 98 F (36.7 C)  SpO2:  97%    Nursing Triage Note reviewed. Vital signs reviewed and patients oxygen saturation is normoxic  General: Patient is well nourished, well developed, awake and alert, resting comfortably in no acute distress Head: Normocephalic and atraumatic Eyes: Normal inspection, extraocular muscles intact, no conjunctival pallor Ear, nose, throat: Normal external exam, right tympanic membrane with mild erythema and fluid bubble.  Left tympanic membrane with effusion.  He has nasal congestion bilaterally. No intraoral swelling.  There is mild erythema that may be reactive along the removed tooth.  There is no abscess.  Floor mouth is soft Neck: Normal range of motion Respiratory: Patient is in no respiratory distress,  Cardiovascular: Patient is not tachycardic,  GI: Abd SNT with no guarding or rebound  Back: Normal inspection of the back with good strength and range of motion throughout all ext Extremities: pulses intact with good cap refills, no LE pitting edema or calf tenderness Neuro: The patient is alert and oriented to person, place, and time, appropriately conversive, with 5/5  bilat UE/LE strength, no gross motor or sensory defects noted. Coordination appears to be adequate. Skin: Warm, dry, and intact Psych: normal mood and affect, no SI or HI  ED Results / Procedures / Treatments   Labs (all labs ordered are listed, but only abnormal results are displayed) Labs Reviewed - No data to display   EKG None  RADIOLOGY None    PROCEDURES:  Critical Care performed: No  Procedures   MEDICATIONS ORDERED IN ED: Medications - No data to display   IMPRESSION / MDM / ASSESSMENT AND PLAN / ED COURSE                                Differential diagnosis includes, but is not limited to, serous effusion, atypical sinusitis, TMJ, hearing loss   ED course: Patient examined in the triage room as we are out of hospital beds.  His right ear is inconclusive for acute otitis media but  he certainly has an effusion.  He also has evidence of congestion bilaterally and concern for sinusitis.  I did review the CT scan completed less than 2 weeks ago done at our facility at 7/23.  I counseled the patient that I did not have a cause today for admission and that he had no focal neurological deficits and do not think further imaging acutely is necessary.  Given his allergy to penicillins, I do think he should be treated for ongoing sinusitis and I have written him for a course of cefdinir  twice daily.  I counseled the patient on the cross-reactivity of cefdinir  with penicillins but he has tolerated them in the past and his allergy to penicillins is a rash. I encouraged him to follow-up again with ENT discussed the indications of an audiogram.  I have started him on a nasal hygiene protocol.  I have also given the patient a small amount of Percocet which the patient significant on the states that she will monitor.  He was counseled not to drive or operate any machinery while taking this medication.  All questions answered and patient significant other voiced understanding and requested discharge.  I have placed an outpatient referral for primary care physician urgent follow-up  Patient had multiple questions. I did my best to answer all of them. Significantly more than the usual amount of time was spent trying to explain testing and results diagnosis or lack there of plan for follow-up and further outpatient care and especially reasons to return to the emergency department  At time of discharge there is no evidence of acute life, limb, vision, or fertility threat. Patient has stable vital signs, pain is well controlled, patient is ambulatory and p.o. tolerant.  Discharge instructions were completed using the [employer] system. I would refer you to those at this time. All warnings prescriptions follow-up etc. were discussed in detail with the patient. Patient indicates understanding and is agreeable with  this plan. All questions answered.  Patient is made aware that they may return to the emergency department for any worsening or new condition or for any other emergency.  Suggested E/M Coding Level: 4, 99284  This level has been selected based on the 11-25-2021 CPT guidelines for E/M codes in the Emergency Department based on 2/3 of the CoPA, Data, and Risk.  COPA: The patient has an acute illness with systemic symptoms: sinusitis Risk: This patient has a moderate risk of morbidity as evidenced by the following further diagnostic testing  or treatment actions: Prescription drug management        FINAL CLINICAL IMPRESSION(S) / ED DIAGNOSES   Final diagnoses:  Otalgia of both ears  Pain, dental  Acute sinusitis, recurrence not specified, unspecified location     Rx / DC Orders   ED Discharge Orders          Ordered    oxyCODONE -acetaminophen  (PERCOCET) 5-325 MG tablet  Every 4 hours PRN        11/03/23 2145    oxymetazoline  (AFRIN) 0.05 % nasal spray  2 times daily        11/03/23 2145    cefdinir  (OMNICEF ) 300 MG capsule  2 times daily        11/03/23 2145    chlorhexidine  (PERIDEX ) 0.12 % solution  2 times daily        11/03/23 2145    Ambulatory Referral to Primary Care (Establish Care)        11/03/23 2204             Note:  This document was prepared using Dragon voice recognition software and may include unintentional dictation errors.   Nicholaus Rolland BRAVO, MD 11/03/23 2225

## 2023-11-03 NOTE — ED Triage Notes (Signed)
 Pt presents to the ED via POV with complaints of bilateral ear pain R>L that started a week ago. Endorses some ringing and soreness in his ears. He notes taking Tylenol  with minimal improvement. A&Ox4 at this time. Denies drainage, fevers, chills, vision changes, dental pain, headache, CP or SOB.

## 2023-11-03 NOTE — Discharge Instructions (Addendum)
 You were seen in the emergency department for ongoing your pain, nasal congestion and dental pain.  Exam was reassuring and you do not need admission today.  I prescribed you a small amount of pain medication.  You need to start placing yourself on a nasal saline regimen.  In the morning give yourself a dose of Afrin, follow this with over-the-counter nasal saline (can be purchased at any pharmacy) or a Nettie pot and do this again in the evening.  Take your antibiotic as prescribed (be aware that you are allergic to penicillins and although you have had cephalosporins in the past there can be a cross-reactivity with a rash).  Continue to use your Peridex  rinse.  I would follow-up with your ENT specialist 1 more time and discuss whether you need an audiogram -- RETURN PRECAUTIONS & AFTERCARE: (ENGLISH) RETURN PRECAUTIONS: Return immediately to the emergency department or see/call your doctor if you feel worse, weak or have changes in speech or vision, are short of breath, have fever, vomiting, pain, bleeding or dark stool, trouble urinating or any new issues. Return here or see/call your doctor if not improving as expected for your suspected condition. FOLLOW-UP CARE: Call your doctor and/or any doctors we referred you to for more advice and to make an appointment. Do this today, tomorrow or after the weekend. Some doctors only take PPO insurance so if you have HMO insurance you may want to contact your HMO or your regular doctor for referral to a specialist within your plan. Either way tell the doctor's office that it was a referral from the emergency department so you get the soonest possible appointment.  YOUR TEST RESULTS: Take result reports of any blood or urine tests, imaging tests and EKG's to your doctor and any referral doctor. Have any abnormal tests repeated. Your doctor or a referral doctor can let you know when this should be done. Also make sure your doctor contacts this hospital to get any test  results that are not currently available such as cultures or special tests for infection and final imaging reports, which are often not available at the time you leave the ER but which may list additional important findings that are not documented on the preliminary report. BLOOD PRESSURE: If your blood pressure was greater than 120/80 have your blood pressure rechecked within 1 to 2 weeks. MEDICATION SIDE EFFECTS: Do not drive, walk, bike, take the bus, etc. if you have received or are being prescribed any sedating medications such as those for pain or anxiety or certain antihistamines like Benadryl. If you have been give one of these here get a taxi home or have a friend drive you home. Ask your pharmacist to counsel you on potential side effects of any new medication

## 2023-11-04 MED ORDER — OXYCODONE-ACETAMINOPHEN 5-325 MG PO TABS: 1.0000 | ORAL_TABLET | ORAL | 0 refills | Status: AC | PRN

## 2024-01-06 ENCOUNTER — Other Ambulatory Visit: Payer: Self-pay

## 2024-01-06 ENCOUNTER — Emergency Department
Admission: EM | Admit: 2024-01-06 | Discharge: 2024-01-06 | Disposition: A | Discharge status: Home / Self Care | Payer: MEDICAID | Attending: Emergency Medicine | Admitting: Emergency Medicine

## 2024-01-06 DIAGNOSIS — H6691 Otitis media, unspecified, right ear: Secondary | ICD-10-CM | POA: Diagnosis not present

## 2024-01-06 DIAGNOSIS — H9201 Otalgia, right ear: Secondary | ICD-10-CM | POA: Diagnosis present

## 2024-01-06 DIAGNOSIS — H6690 Otitis media, unspecified, unspecified ear: Secondary | ICD-10-CM

## 2024-01-06 DIAGNOSIS — J449 Chronic obstructive pulmonary disease, unspecified: Secondary | ICD-10-CM | POA: Diagnosis not present

## 2024-01-06 LAB — RESP PANEL BY RT-PCR (RSV, FLU A&B, COVID)  RVPGX2
Influenza A by PCR: NEGATIVE
Influenza B by PCR: NEGATIVE
Resp Syncytial Virus by PCR: NEGATIVE
SARS Coronavirus 2 by RT PCR: NEGATIVE

## 2024-01-06 MED ORDER — CEFUROXIME AXETIL 500 MG PO TABS: 500.0000 mg | ORAL_TABLET | Freq: Two times a day (BID) | ORAL | 0 refills | Status: AC

## 2024-01-06 MED ORDER — CEFUROXIME AXETIL 500 MG PO TABS
500.0000 mg | ORAL_TABLET | Freq: Once | ORAL | Status: DC
  Filled 2024-01-06: qty 1

## 2024-01-06 NOTE — ED Provider Notes (Signed)
 [facility] Provider Note    Event Date/Time   First MD Initiated Contact with Patient 01/06/24 1841     (approximate)   History   Otalgia   HPI  Kyle Mcmillan is a 38 y.o. male with PMH of COPD, anxiety and depression who presents for evaluation of otalgia. Patient endorses pain for over a week. States he has had sinus congestion, headaches and some dental pain. He has not noticed any drainage from the ears. No fevers.       Physical Exam   Triage Vital Signs: ED Triage Vitals [01/06/24 1814]  Encounter Vitals Group     BP (!) 148/92     Girls Systolic BP Percentile      Girls Diastolic BP Percentile      Boys Systolic BP Percentile      Boys Diastolic BP Percentile      Pulse Rate 88     Resp 18     Temp 98.6 F (37 C)     Temp Source Oral     SpO2 99 %     Weight 290 lb (131.5 kg)     Height 5' 11 (1.803 m)     Head Circumference      Peak Flow      Pain Score 8     Pain Loc      Pain Education      Exclude from Growth Chart     Most recent vital signs: Vitals:   01/06/24 1814  BP: (!) 148/92  Pulse: 88  Resp: 18  Temp: 98.6 F (37 C)  SpO2: 99%   General: Awake, no distress.  CV:  Good peripheral perfusion. RRR. Resp:  Normal effort. CTAB. Abd:  No distention.  Other:  Right EAC is clear, right TM is erythematous and bulging, left EAC is clear, left TM is translucent, no pain with retraction of the pinna bilaterally, oral mucous membranes are moist, no erythema, swelling or abscess noted at the location of patients dental pain on the right lower jaw, no visible teeth in this location as he has had them pulled    ED Results / Procedures / Treatments   Labs (all labs ordered are listed, but only abnormal results are displayed) Labs Reviewed  RESP PANEL BY RT-PCR (RSV, FLU A&B, COVID)  [id number]   PROCEDURES:  Critical Care performed: No  Procedures   MEDICATIONS ORDERED IN ED: Medications  cefUROXime (CEFTIN)  tablet 500 mg (has no administration in time range)     IMPRESSION / MDM / ASSESSMENT AND PLAN / ED COURSE  I reviewed the triage vital signs and the nursing notes.                             38 year old male presents for evaluation of otalgia. BP is elevated, VSS otherwise. Patient NAD on exam.  Differential diagnosis includes, but is not limited to, otitis media, otitis externa, sinus infection, viral URI.  Patient's presentation is most consistent with acute complicated illness / injury requiring diagnostic workup.  Resp panel is negative for flu, covid and rsv.   Right TM is erythematous and bulging consistent with otitis media. This was likely the result of a sinus infection. Will start patient on oral antibiotics. Patient endorses allergies to amoxicillin and clindamycin  but states he has been able to tolerate cephalosporins. He was given his first dose of antibiotics while in the  ED. Did discuss return precautions. Patient does not have PCP so will provide resources for this. Also gave patient ENT follow up. Patient voiced understanding, all questions were answered and he was stable at discharge.       FINAL CLINICAL IMPRESSION(S) / ED DIAGNOSES   Final diagnoses:  Acute otitis media, unspecified otitis media type     Rx / DC Orders   ED Discharge Orders          Ordered    cefUROXime (CEFTIN) 500 MG tablet  2 times daily with meals        01/06/24 2017             Note:  This document was prepared using Dragon voice recognition software and may include unintentional dictation errors.   Cleaster Tinnie LABOR, PA-C 01/06/24 2019    Waymond Lorelle Cummins, MD 01/06/24 2222

## 2024-01-06 NOTE — Discharge Instructions (Signed)
 Your ear pain is caused by an ear infection. I have sent antibiotics to the pharmacy for you, please take them as prescribed.

## 2024-01-06 NOTE — ED Triage Notes (Signed)
 Patient states bilateral ear pain and sinus pressure for 1.5 weeks.

## 2024-01-09 ENCOUNTER — Telehealth: Payer: Self-pay

## 2024-01-09 NOTE — Telephone Encounter (Signed)
 Ordering provider: Dr. Francyne Associated diagnoses: Snoring [R06.83]  Patient NOT notified of PIN ([id number]) on 01/09/2024   Patient stated he will come by next week to pick up the Watch Pat One device. Patient is aware to pick up any weekday next week, except for Wednesday from the hours of 8 am-3 pm.  Phone note routed to covering staff for follow-up.

## 2024-02-11 NOTE — Telephone Encounter (Signed)
 Second attempt Spoke with patient via phone and he stated that he will be able to come by this week to pick up the Watch Pat One device. Patient was given address,location and the names to ask for to pick up the device. Patient verbalized understanding.

## 2024-03-05 ENCOUNTER — Other Ambulatory Visit: Payer: Self-pay
